# Patient Record
Sex: Female | Born: 1955 | Race: Black or African American | Hispanic: No | Smoking: Never smoker
Health system: Southern US, Community
[De-identification: ages and names within clinical notes are randomized; demographics above are authoritative.]

## PROBLEM LIST (undated history)

## (undated) DIAGNOSIS — I10 Essential (primary) hypertension: Secondary | ICD-10-CM

## (undated) DIAGNOSIS — F0280 Dementia in other diseases classified elsewhere without behavioral disturbance: Secondary | ICD-10-CM

## (undated) DIAGNOSIS — E079 Disorder of thyroid, unspecified: Secondary | ICD-10-CM

## (undated) DIAGNOSIS — R413 Other amnesia: Secondary | ICD-10-CM

## (undated) DIAGNOSIS — E785 Hyperlipidemia, unspecified: Secondary | ICD-10-CM

## (undated) DIAGNOSIS — G300 Alzheimer's disease with early onset: Secondary | ICD-10-CM

## (undated) DIAGNOSIS — E119 Type 2 diabetes mellitus without complications: Secondary | ICD-10-CM

## (undated) HISTORY — DX: Hyperlipidemia, unspecified: E78.5

## (undated) HISTORY — DX: Other amnesia: R41.3

## (undated) HISTORY — PX: COLONOSCOPY: SHX174

## (undated) HISTORY — DX: Disorder of thyroid, unspecified: E07.9

## (undated) HISTORY — PX: BREAST BIOPSY: SHX20

## (undated) HISTORY — PX: PARTIAL HYSTERECTOMY: SHX80

---

## 2000-06-27 ENCOUNTER — Inpatient Hospital Stay (HOSPITAL_COMMUNITY): Admission: AD | Admit: 2000-06-27 | Discharge: 2000-06-28 | Payer: Self-pay | Admitting: Cardiology

## 2002-03-13 ENCOUNTER — Other Ambulatory Visit: Admission: RE | Admit: 2002-03-13 | Discharge: 2002-03-13 | Payer: Self-pay | Admitting: Family Medicine

## 2005-05-09 ENCOUNTER — Emergency Department (HOSPITAL_COMMUNITY): Admission: EM | Admit: 2005-05-09 | Discharge: 2005-05-09 | Payer: Self-pay | Admitting: Emergency Medicine

## 2009-09-05 ENCOUNTER — Ambulatory Visit (HOSPITAL_COMMUNITY): Admission: RE | Admit: 2009-09-05 | Discharge: 2009-09-05 | Payer: Self-pay | Admitting: Internal Medicine

## 2009-09-18 ENCOUNTER — Ambulatory Visit (HOSPITAL_COMMUNITY): Admission: RE | Admit: 2009-09-18 | Discharge: 2009-09-18 | Payer: Self-pay | Admitting: Internal Medicine

## 2011-04-23 NOTE — Discharge Summary (Signed)
[place]  Patient:    Nicole Ward, Nicole Ward                       MRN: [id number] Adm. Date:  06/27/00 Disc. Date: 06/28/00 Attending:  Madolyn Frieze. Jens Som, M.D. [facility] Dictator:   Gene Serpe, P.A.                           Discharge Summary  PROCEDURES:  Exercise Cardiolite on July 24.  REASON FOR ADMISSION:  Please refer to dictated admission note.  LABORATORY DATA:  Cardiac enzymes: CPK 212/1.2, 166/1.3; troponin I less than 0.03 (x 2).  Metabolic profile within normal limits.  LFTs: Elevated total bilirubin of 1.3, otherwise normal.  Normal CBC.  Admission chest x-ray: Low lung volumes, cannot exclude mild infiltrates at lung bases.  HOSPITAL COURSE:  Following admission, the patient ruled out for MI with negative serial cardiac enzymes.  Serial EKGs, however, were notable for mild inferolateral ST changes.  The patient was referred for exercise stress testing.  The patient exercised a total 7 minutes 30 seconds on standard Bruce protocol, achieving 99% of predicted maximal heart rate, 9.3 METs.  No worsening of baseline chest pain was elicited during exercise.  Pseudonormalization of baseline ST abnormalities were noted during exercise with return to baseline by 5-minute recovery mark.  Subsequent perfusion images revealed no abnormalities, EF 61%.  FINAL RECOMMENDATIONS:  Continue Motrin and Pepcid for management of probable musculoskeletal chest discomfort.  The patient will need followup LFTs given the mildly elevated total bilirubin.  DISCHARGE MEDICATIONS: 1. Pepcid 20 mg b.i.d. 2. Motrin 800 mg t.i.d.  DISCHARGE INSTRUCTIONS:  The patient is scheduled to follow up with Olga Millers on Wednesday, August 1, at 4 p.m.  DISCHARGE DIAGNOSES: 1. Noncardiac chest pain with normal exercise Cardiolite July 24. 2. Mildly elevated liver function tests. 3. Ventricular ectopy. 4. History of hyperlipidemia. DD:  06/28/00 TD:  06/28/00 Job:  [id number]

## 2011-05-21 ENCOUNTER — Other Ambulatory Visit (HOSPITAL_COMMUNITY): Payer: Self-pay | Admitting: Internal Medicine

## 2011-05-21 DIAGNOSIS — Z09 Encounter for follow-up examination after completed treatment for conditions other than malignant neoplasm: Secondary | ICD-10-CM

## 2011-05-21 LAB — LIPID PANEL
Cholesterol, Total: 347
LDL Cholesterol: 231 mg/dL

## 2011-05-21 LAB — BASIC METABOLIC PANEL: Calcium: 10.3 mg/dL

## 2011-05-21 LAB — HEPATIC FUNCTION PANEL
ALT: 18 U/L (ref 7–35)
Albumin: 5.1
Bilirubin, Indirect: 1.3
Total Bilirubin: 1.4 mg/dL
platelet count: 294

## 2011-05-26 ENCOUNTER — Ambulatory Visit (HOSPITAL_COMMUNITY)
Admission: RE | Admit: 2011-05-26 | Discharge: 2011-05-26 | Disposition: A | Discharge status: Home / Self Care | Payer: BC Managed Care – PPO | Source: Ambulatory Visit | Attending: Internal Medicine | Admitting: Internal Medicine

## 2011-05-26 DIAGNOSIS — Z09 Encounter for follow-up examination after completed treatment for conditions other than malignant neoplasm: Secondary | ICD-10-CM

## 2011-05-26 DIAGNOSIS — R928 Other abnormal and inconclusive findings on diagnostic imaging of breast: Secondary | ICD-10-CM | POA: Insufficient documentation

## 2011-05-27 ENCOUNTER — Telehealth: Payer: Self-pay

## 2011-05-27 NOTE — Telephone Encounter (Signed)
Called pt and gave her appt for 06/01/2011 at 1:15 pm with Tana Coast, PA. Pt was told to arrive at 1:00 PM to register, adn bring insurance cards and list of medications.

## 2011-05-27 NOTE — Telephone Encounter (Signed)
Referral sent on 05/21/2011. LMOM on 05/21/2011 for pt to call. 05/24/2011 pt called and said she thought she had an appointment with Dr. Jena Gauss on Friday. I checked and told her no. She said she has had some abd pain and I told her that she would need an OV first. She said she was going to call Dr. Felecia Shelling back first. She left VM on 05/25/2011 that she could not come to see Dr. Jena Gauss on Fri ( did not have an appt). York Spaniel that she was going to be out of town this week-end, but that she could see him on Mon or Tues next week.

## 2011-06-01 ENCOUNTER — Ambulatory Visit (INDEPENDENT_AMBULATORY_CARE_PROVIDER_SITE_OTHER): Payer: BC Managed Care – PPO | Admitting: Gastroenterology

## 2011-06-01 ENCOUNTER — Encounter: Payer: Self-pay | Admitting: Gastroenterology

## 2011-06-01 VITALS — BP 154/78 | HR 109 | Temp 98.6°F | Ht 66.0 in | Wt 136.6 lb

## 2011-06-01 DIAGNOSIS — R109 Unspecified abdominal pain: Secondary | ICD-10-CM

## 2011-06-01 DIAGNOSIS — K5909 Other constipation: Secondary | ICD-10-CM | POA: Insufficient documentation

## 2011-06-01 DIAGNOSIS — R1030 Lower abdominal pain, unspecified: Secondary | ICD-10-CM | POA: Insufficient documentation

## 2011-06-01 DIAGNOSIS — Z800 Family history of malignant neoplasm of digestive organs: Secondary | ICD-10-CM

## 2011-06-01 DIAGNOSIS — K5900 Constipation, unspecified: Secondary | ICD-10-CM

## 2011-06-01 DIAGNOSIS — R634 Abnormal weight loss: Secondary | ICD-10-CM

## 2011-06-01 MED ORDER — POLYETHYLENE GLYCOL 3350 GRAN: 17.0000 g | GRANULES | Freq: Every day | Status: DC | PRN

## 2011-06-01 NOTE — Assessment & Plan Note (Signed)
Abnormal weight loss, 50 pounds in the last 3 months per her report. She states she is eating plan he has a great appetite. It has been more than 5 years and she's had blood work done. Arrange for thyroid status to be checked. She also has concerns about the possibility of sexually transmitted diseases such as HIV. Her husband has a drug problem and currently in jail due to cocaine use. She denies any drug use. Admits to a lot of stress. Also proceed with colonoscopy as planned.

## 2011-06-01 NOTE — Progress Notes (Signed)
Primary Care Physician:  Avon Gully, MD  Primary Gastroenterologist:  Jonette Eva  Chief Complaint  Patient presents with  . Abdominal Pain    HPI:  Nicole Ward is a 55 y.o. female here for consideration of colonoscopy. She also complains of lower abdominal pain, chronic constipation, 50 pound unintentional weight loss. Her last colonoscopy she tells me was over 6 years ago. She does not recall where it was done or the findings. Her father had colon cancer in his 51s. He also had alcoholic cirrhosis.  Just saw Dr. Felecia Shelling for the first time to establish care. Has not been to doctor in over six years. Used to be on medications for hyperlipidemia but has been off of them for quite some time. Chronic constipation, trying to eat more fiber. BM twice per week, hard stool. No brbpr, melena. Lower abdominal pain associated with constipation. No n/v, heartburn. Weight down fifty pounds since 02/2011. Used to weight 180+ pounds. States she has a great appetite and eats all the time. She works third shift. She states she's under a lot of stress because her husband is a cocaine user and currently in jail. She was to be checked for STDs. Recent mammogram okay. No recent blood work done.  Current Outpatient Prescriptions  Medication Sig Dispense Refill  . Multiple Vitamin (MULTIVITAMIN) capsule Take 1 capsule by mouth daily.        . Polyethylene Glycol 3350 GRAN Take 17 g by mouth daily as needed.  524 g  5    Allergies as of 06/01/2011  . (No Known Allergies)    Past Medical History  Diagnosis Date  . Hyperlipidemia     off meds for years    Past Surgical History  Procedure Date  . Partial hysterectomy   . Breast biopsy     benign  . Colonoscopy approx 2005    patient not sure of findings or where procedure done    Family History  Problem Relation Age of Onset  . Colon cancer Father     late 43s  . Liver disease Father     cirrhosis, etoh    History   Social History  .  Marital Status: Divorced    Spouse Name: N/A    Number of Children: 1  . Years of Education: N/A   Occupational History  .  [employer]   Social History Main Topics  . Smoking status: Never Smoker   . Smokeless tobacco: Not on file  . Alcohol Use: No  . Drug Use: No  . Sexually Active: Not on file   Other Topics Concern  . Not on file   Social History Narrative   Patient states she remarried her husband in 61. States he is in jail for cocaine use. Under a lot of stress.      ROS:  General: Negative for anorexia, fever, chills, fatigue, weakness. Complains of weight loss Eyes: Negative for vision changes.  ENT: Negative for hoarseness, difficulty swallowing , nasal congestion. CV: Negative for chest pain, angina, palpitations, dyspnea on exertion, peripheral edema.  Respiratory: Negative for dyspnea at rest, dyspnea on exertion, cough, sputum, wheezing.  GI: See history of present illness. GU:  Negative for dysuria, hematuria, urinary incontinence, urinary frequency, nocturnal urination.  MS: Negative for joint pain, low back pain.  Derm: Negative for rash or itching.  Neuro: Negative for weakness, abnormal sensation, seizure, frequent headaches, memory loss, confusion.  Psych: Negative for anxiety, depression, suicidal ideation, hallucinations. Complains of stress. Endo:  50 pound weight loss, unintentional Heme: Negative for bruising or bleeding. Allergy: Negative for rash or hives.    Physical Examination:  BP 154/78  Pulse 109  Temp(Src) 98.6 F (37 C) (Temporal)  Ht 5\' 6"  (1.676 m)  Wt 136 lb 9.6 oz (61.961 kg)  BMI 22.05 kg/m2   General: Well-nourished, well-developed in no acute distress.  Head: Normocephalic, atraumatic.   Eyes: Conjunctiva pink, no icterus. Mouth: Oropharyngeal mucosa moist and pink , no lesions erythema or exudate. Neck: Supple without thyromegaly, masses, or lymphadenopathy.  Lungs: Clear to auscultation bilaterally.  Heart: Regular  rate and rhythm, no murmurs rubs or gallops.  Abdomen: Bowel sounds are normal, nontender, nondistended, no hepatosplenomegaly or masses, no abdominal bruits or    hernia , no rebound or guarding.   Extremities: No lower extremity edema.  Neuro: Alert and oriented x 4 , grossly normal neurologically.  Skin: Warm and dry, no rash or jaundice.   Psych: Alert and cooperative, normal mood and affect.

## 2011-06-01 NOTE — Assessment & Plan Note (Signed)
Chronic constipation associated with lower abdominal pain. Family history of colon cancer, father in his 35s. Her last colonoscopy over 5 years ago. TCS in near future.  I have discussed the risks, alternatives, benefits with regards to but not limited to the risk of reaction to medication, bleeding, infection, perforation and the patient is agreeable to proceed. Written consent to be obtained.  Check thyroid status, calcium level. Add Miralax.

## 2011-06-01 NOTE — Assessment & Plan Note (Signed)
Labs as scheduled.

## 2011-06-04 NOTE — Progress Notes (Signed)
Agree. Add AMITIZA if no lesion on TCS.

## 2011-06-07 ENCOUNTER — Encounter: Payer: BC Managed Care – PPO | Admitting: Gastroenterology

## 2011-06-07 ENCOUNTER — Ambulatory Visit (HOSPITAL_COMMUNITY)
Admission: RE | Admit: 2011-06-07 | Discharge: 2011-06-07 | Disposition: A | Discharge status: Home / Self Care | Payer: BC Managed Care – PPO | Source: Ambulatory Visit | Attending: Gastroenterology | Admitting: Gastroenterology

## 2011-06-07 DIAGNOSIS — R634 Abnormal weight loss: Secondary | ICD-10-CM | POA: Insufficient documentation

## 2011-06-07 DIAGNOSIS — K648 Other hemorrhoids: Secondary | ICD-10-CM | POA: Insufficient documentation

## 2011-06-07 DIAGNOSIS — R109 Unspecified abdominal pain: Secondary | ICD-10-CM

## 2011-06-07 DIAGNOSIS — K5900 Constipation, unspecified: Secondary | ICD-10-CM

## 2011-06-07 DIAGNOSIS — Z800 Family history of malignant neoplasm of digestive organs: Secondary | ICD-10-CM

## 2011-06-07 DIAGNOSIS — K5909 Other constipation: Secondary | ICD-10-CM | POA: Insufficient documentation

## 2011-06-07 NOTE — Progress Notes (Signed)
Cc to Dr. Fanta 

## 2011-06-16 NOTE — Progress Notes (Signed)
Pt came by the office. Would like Tana Coast, PA, to refer to PCP. Per Verlon Au, informed pt to call Dr. Anthony Sar office since they are accepting new pt's now. I also asked pt about the labs, she has not done them yet. She does not have the orders. I told her I would fax to [facility] so she can follow-up as Verlon Au had recommended. Orders faxed!

## 2011-06-21 NOTE — Progress Notes (Signed)
Letter mailed to remind pt to do labs.

## 2011-06-30 NOTE — Op Note (Signed)
  NAME:  Nicole Ward, Nicole Ward              ACCOUNT NO.:  [id number]  MEDICAL RECORD NO.:  [id number]  LOCATION:  [facility]                          FACILITY:  [facility]  PHYSICIAN:  Jonette Eva, M.D.     DATE OF BIRTH:  [birth year]  DATE OF PROCEDURE:  06/07/2011 DATE OF DISCHARGE:                              OPERATIVE REPORT   REFERRING PROVIDER:  Tesfaye D. Felecia Shelling, MD  PROCEDURE:  Ileocolonoscopy.  INDICATION FOR EXAMINATION:  Nicole Ward is a 55 year old female who presents with unintentional 50-pound weight loss, lower abdominal pain, and constipation.  Her father had colon cancer in his 11s, not his 45s. The patient denies nausea and vomiting.  FINDINGS: 1. Normal terminal ileum, approximately 20 cm visualized. 2. Normal colon without evidence of polyps, masses, inflammatory     changes, or diverticular AVMs. 3. Small internal hemorrhoids.  Otherwise, normal retroflexed view of     the rectum.  RECOMMENDATIONS: 1. Screening colonoscopy in 10 years.  Her first-degree relative had     colon cancer at age greater than 59. 2. Use MiraLax once daily. 3. Drink 6-8 cups daily. 4. She should follow a high-fiber diet.  She is given a handout on     high-fiber diet, constipation, and hemorrhoids.  MEDICATIONS: 1. Demerol 75 mg IV. 2. Versed 4 mg IV.  PROCEDURE TECHNIQUE:  Physical exam was performed.  Informed consent was obtained from the patient after explaining the benefits, risks, and alternatives to the procedure.  The patient was connected to the monitor and placed in left lateral position.  Continuous oxygen was provided by nasal cannula and IV medicine administered through an indwelling cannula.  After administration of sedation and rectal exam, the patient's rectum was intubated and the scope was advanced under direct visualization to the distal terminal ileum.  The scope was removed slowly by careful examining the color, texture, anatomy, and integrity of mucosa on the way out.   The patient was recovered in Endoscopy and discharged home in satisfactory condition.     Jonette Eva, M.D.     SF/MEDQ  D:  06/07/2011  T:  06/08/2011  Job:  [id number]  Electronically Signed by Jonette Eva M.D. on 06/30/2011 12:51:01 PM

## 2011-08-24 ENCOUNTER — Other Ambulatory Visit (HOSPITAL_COMMUNITY): Payer: Self-pay | Admitting: Internal Medicine

## 2011-08-24 DIAGNOSIS — R413 Other amnesia: Secondary | ICD-10-CM

## 2011-08-27 ENCOUNTER — Ambulatory Visit (HOSPITAL_COMMUNITY): Payer: BC Managed Care – PPO

## 2011-09-07 ENCOUNTER — Other Ambulatory Visit (HOSPITAL_COMMUNITY): Payer: Self-pay | Admitting: Internal Medicine

## 2011-09-07 DIAGNOSIS — R413 Other amnesia: Secondary | ICD-10-CM

## 2011-09-15 ENCOUNTER — Ambulatory Visit (HOSPITAL_COMMUNITY)
Admission: RE | Admit: 2011-09-15 | Discharge: 2011-09-15 | Disposition: A | Discharge status: Home / Self Care | Payer: BC Managed Care – PPO | Source: Ambulatory Visit | Attending: Internal Medicine | Admitting: Internal Medicine

## 2011-09-15 DIAGNOSIS — H538 Other visual disturbances: Secondary | ICD-10-CM | POA: Insufficient documentation

## 2011-09-15 DIAGNOSIS — R413 Other amnesia: Secondary | ICD-10-CM | POA: Insufficient documentation

## 2012-05-29 ENCOUNTER — Other Ambulatory Visit: Payer: Self-pay | Admitting: Neurology

## 2012-05-29 DIAGNOSIS — R4182 Altered mental status, unspecified: Secondary | ICD-10-CM

## 2012-05-31 ENCOUNTER — Ambulatory Visit (HOSPITAL_COMMUNITY)
Admission: RE | Admit: 2012-05-31 | Discharge: 2012-05-31 | Disposition: A | Discharge status: Home / Self Care | Payer: BC Managed Care – PPO | Source: Ambulatory Visit | Attending: Neurology | Admitting: Neurology

## 2012-05-31 DIAGNOSIS — R209 Unspecified disturbances of skin sensation: Secondary | ICD-10-CM | POA: Insufficient documentation

## 2012-05-31 DIAGNOSIS — R4182 Altered mental status, unspecified: Secondary | ICD-10-CM

## 2012-05-31 DIAGNOSIS — R413 Other amnesia: Secondary | ICD-10-CM | POA: Insufficient documentation

## 2013-04-11 DIAGNOSIS — F32A Depression, unspecified: Secondary | ICD-10-CM | POA: Insufficient documentation

## 2013-04-11 DIAGNOSIS — F329 Major depressive disorder, single episode, unspecified: Secondary | ICD-10-CM | POA: Insufficient documentation

## 2013-05-03 ENCOUNTER — Ambulatory Visit (HOSPITAL_COMMUNITY): Payer: BC Managed Care – PPO | Admitting: Psychology

## 2013-05-08 ENCOUNTER — Ambulatory Visit (INDEPENDENT_AMBULATORY_CARE_PROVIDER_SITE_OTHER): Payer: BC Managed Care – PPO | Admitting: Psychology

## 2013-05-08 DIAGNOSIS — F329 Major depressive disorder, single episode, unspecified: Secondary | ICD-10-CM

## 2013-05-08 DIAGNOSIS — R413 Other amnesia: Secondary | ICD-10-CM

## 2013-05-18 ENCOUNTER — Encounter (HOSPITAL_COMMUNITY): Payer: Self-pay | Admitting: Psychology
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
## 2013-06-05 ENCOUNTER — Ambulatory Visit (INDEPENDENT_AMBULATORY_CARE_PROVIDER_SITE_OTHER): Payer: BC Managed Care – PPO | Admitting: Psychology

## 2013-06-05 DIAGNOSIS — F0280 Dementia in other diseases classified elsewhere without behavioral disturbance: Secondary | ICD-10-CM

## 2013-06-05 DIAGNOSIS — G309 Alzheimer's disease, unspecified: Secondary | ICD-10-CM

## 2013-06-07 ENCOUNTER — Ambulatory Visit (HOSPITAL_COMMUNITY): Payer: Self-pay | Admitting: Psychology

## 2013-06-13 ENCOUNTER — Other Ambulatory Visit (HOSPITAL_COMMUNITY): Payer: Self-pay

## 2013-06-13 DIAGNOSIS — G4730 Sleep apnea, unspecified: Secondary | ICD-10-CM

## 2013-06-15 ENCOUNTER — Ambulatory Visit (INDEPENDENT_AMBULATORY_CARE_PROVIDER_SITE_OTHER): Payer: BC Managed Care – PPO | Admitting: Psychology

## 2013-06-15 DIAGNOSIS — F0280 Dementia in other diseases classified elsewhere without behavioral disturbance: Secondary | ICD-10-CM

## 2013-06-27 ENCOUNTER — Ambulatory Visit: Payer: BC Managed Care – PPO | Attending: Neurology | Admitting: Sleep Medicine

## 2013-06-27 VITALS — Ht 63.0 in | Wt 135.0 lb

## 2013-06-27 DIAGNOSIS — G4730 Sleep apnea, unspecified: Secondary | ICD-10-CM

## 2013-06-27 DIAGNOSIS — R5381 Other malaise: Secondary | ICD-10-CM | POA: Insufficient documentation

## 2013-06-27 DIAGNOSIS — G4710 Hypersomnia, unspecified: Secondary | ICD-10-CM | POA: Insufficient documentation

## 2013-06-27 DIAGNOSIS — R5383 Other fatigue: Secondary | ICD-10-CM | POA: Insufficient documentation

## 2013-06-27 DIAGNOSIS — R0609 Other forms of dyspnea: Secondary | ICD-10-CM | POA: Insufficient documentation

## 2013-06-27 DIAGNOSIS — R0989 Other specified symptoms and signs involving the circulatory and respiratory systems: Secondary | ICD-10-CM | POA: Insufficient documentation

## 2013-06-30 NOTE — Procedures (Signed)
[facility] [name] A. Gerilyn Pilgrim, MD     [email or web address]        NAME:  Nicole Ward, Nicole Ward              ACCOUNT NO.:  [id number]  MEDICAL RECORD NO.:  [id number]          PATIENT TYPE:  OUT  LOCATION:  SLEEP LAB                     FACILITY:  [facility]  PHYSICIAN:  Chaz Mcglasson A. Gerilyn Pilgrim, M.D. DATE OF BIRTH:  Jan 31, 1956  DATE OF STUDY:  06/27/2013                           NOCTURNAL POLYSOMNOGRAM  REFERRING PHYSICIAN:  [name] A. Gerilyn Pilgrim, M.D.  INDICATIONS:  A 57 year old lady, who presents with hypersomnia, daytime fatigue, and difficulty sleeping.  There is also history of snoring.  MEDICATIONS:  Donepezil, glipizide, lisinopril.  EPWORTH SLEEPINESS SCALE:  6.  BMI 24.  ARCHITECTURAL SUMMARY:  The total recording time is 401 minutes.  Sleep efficiency 67%.  Sleep latency is 35%.  REM latency is pathologically early at 4.5.  Stage N1 70%, N2 3%, N 3 0%, and REM sleep 30%.  RESPIRATORY SUMMARY:  Baseline oxygen saturation is 98, lowest saturation 94% during both REM and non-REM sleep.  Diagnostic AHI 1 and RDI 3.  LIMB MOVEMENT SUMMARY:  PLM index zero.  ELECTROCARDIOGRAM SUMMARY:  Average heart rate is 50 with no significant dysrhythmias observed.  IMPRESSION:  Abnormal sleep architecture with pathologically early REM latency, which can be seen in narcolepsy and REM rebound phenomenon. The patient should be set up to have a daytime nap study (MSLT) to evaluate for the potential of narcolepsy.    [name] A. Gerilyn Pilgrim, M.D.    KAD/MEDQ  D:  06/30/2013 13:01:10  T:  06/30/2013 13:23:49  Job:  [id number]

## 2013-09-03 ENCOUNTER — Encounter (HOSPITAL_COMMUNITY): Payer: Self-pay | Admitting: Psychology

## 2013-09-03 NOTE — Progress Notes (Signed)
Provided feedback regarding the results of the  recent neuropsychological evaluation. results of this evaluation can be found in the report of 06/05/2013. The patient is scheduled to return in 6 months for reassessment as there continues to be an ongoing differential diagnostic question regarding vascular mediated dementia versus early onset Alzheimer's.

## 2013-09-03 NOTE — Progress Notes (Signed)
The patient was assessed on 06/05/2013.  The patient was administered a neuropsychological test battery consisting of the Wechsler Adult Intelligence Scale-III and the Wechsler Memory Scale-III.  Behavioral observations suggest that the patient fully participated and gave her best effort and therefore this does appear to be a valid assessment and the patient did appear to try her hardest.  While the patient was somewhat anxious about doing as well as she  could she adapted to the testing situation fairly well and this does appear to be a fair and valid sample of her current functioning.  GENERAL INTELLECTUAL FUNCTIONING:  The patient's performance on the Weschler Adult Intellectual Scale-III, classifies her current intellectual abilities to be in the average Range, with a Full Scale IQ score of 90, a Verbal IQ score of 91, and a Performance IQ score of 87.  Overall, her performance places her at the 25th percentile with regard to her age-related peer group.  Analysis on Index Scores produced a Verbal Comprehension Index Score of 93, a Perceptual Organization Index Score of 82, a Working Memory Index Score of 99, and a Processing Speed Index Score of 99.   Overall, WAIS-III performance suggest that the patient's VIQ vs. PIQ were consistent with each other there was no indication of global differences between her verbal IQ versus her performance/visual spatial IQ. However, with regard to index scores there were some relative weaknesses with regard to her visual spatial abilities and while these were less than clinically significant they do suggest some visuospatial weaknesses consistent with her geographic disorientation.  Below are the Age-Adjusted scores for each of the individual WAIS-III subtests.  Vocabulary:    9 Similarities:    10 Arithmetic:    9 Digit Span:    11 Information:    7 Comprehension:   5 Letter Number Sequencing:  10  Picture Completion:   9 Digit Symbol:    10 Block  Design:    7 Matrix Reasoning:   7 Picture Arrangement:   7 Symbol Search:   10  WAIS-III analysis reveals a VIQ score of 91, which places her at the 27th percentile.  There was significant scatter was noted in subtest performance.  The patient displayed average performance with regard to her general vocabulary knowledge, verbal reasoning abilities, auditory encoding and auditory attention, and other measures of working memory. Significant relative and comparison deficits were noted with regard to her general fund of information and social comprehension and reasoning abilities.  WAIS-III analysis reveals a PIQ score of 87, which places her at the 18th percentile. There was significant scatter was seen on these measures.   The patient displayed average performance with regard to identifying anomalies out of a visual Gestalt, visual scanning/visual searching and speed in mental operations. Significant deficits were identified with regard to visual analysis and organization, visual reasoning, and visual sequencing.  Overall, WAIS-III performance was in the lower end of the average range.  This pattern of strengths and weaknesses however, suggest some average performances in almost all areas except with regard to her visual analysis and organization and visual spatial skills. The patient also showed significant reductions in her general fund of information and her social judgment and comprehension skills.  ATTENTION/CONCENTRATION AND MEMORY:  On the WMS-III, the patient obtained a Working Memory (Attention and Encoding)  Score of 91, which is consistent with what was predicted by her WAIS-III FSIQ of 90.  Items making up this Index suggest that auditory encoding (Letter-Number Sequencing Scaled Score = 10,  Visual encoding (Spatial Span Scaled Score = 7) is consistent with average performance with regard to auditory encoding..  This overall working memory score does suggest   that is a general strength for  the patient and her inability to maintain good auditory and visual encoding was clearly present during the testing.       Predicted based on FSIQ:  Actual Score  Working Memory:    93    91 Immediate Memory:    94    55 Auditory Immediate:    94    59 Visual Immediate:    96    68 General Memory:    94    62 Auditory Delayed:    94    58 Visual Delayed:    96    75 Auditory Recognition Delayed:   95    70  Anterograde memory functions, as analyzed by the WMS-III, produced an Immediate Memory Index Score of  55, which is  significantly impaired and well below what would be predicted by the [facility] of  90 (prediction was  94). There was  a less than significant difference between the patient's performance on immediate auditory memory versus immediate visual memory abilities as she produced an Verbal Immediate Memory Index Score of  59 and a Visual Immediate Memory Index Score of 68.  However, both of these scores were significantly below predicted levels and consistent with significant memory problems for both visual and auditory information.  Analyses of Delayed Recall of previously presented information produce a General Memory Index Score of  62.  This performance is  significantly below predicted levels and suggest significant memory deficits.  There was  a significant difference between delayed auditory memory versus delayed visual memory, as the patient produced an Auditory Delayed Index Score of  58 and a Visual Delayed Index Score of  75.    she did show a  significant deficit initial learning of auditory information and  significant impairment retention of information.  This suggests  Little benefit from repeated exposures to auditory information. When this recall of auditory material after a delay is changed from a free recall format to a recognition format, the patient's performance showed  mild improvement and suggested that some of the reported memory difficulties have to do with  both storage  and organization of new learning as well as the ability to retrieve information when placed an acute recall format.  CONCLUSIONS:    The overall results of these formal and objective measures of cognitive performance suggest someone who is functioning generally in the lower end of the average range of global intellectual abilities with isolated deficits with regard to her social judgment and comprehension and general fund of information as well as mild to moderate deficits with regard to visual spatial and visual organizational abilities. However, the patient significant deficits have to do with global reductions in her ability to store in learning new information. While the patient's encoding abilities both in regards to her auditory and visual encoding were efficient and consistent with old be predicted based on stable and long-standing cognitive functioning measures the patient showed significant deficits learning new information with auditory learning and memory more impaired and visual learning and memory. However, both were in the impaired to significantly impaired range. The patient showed deficits even after immediately been presented with information and then asked to recall. When placed in a qued recall format, the patient showed very limited improvement, which would suggest that these memory deficits  are not simply problems with regard to retrieval of learn information but are in fact deficits with regard to storage and organization of information in the learning process.  While the patient does have a generally complicated medical picture with abnormalities shown on her MRI that may be related to vascular changes subsequent to type 2 diabetes or other neurological insults, the patient is showing some significant deficits consistent with those found with early-onset dementia of the Alzheimer's type. The patient shows a pattern of significant deficits with regard to visual spatial abilities and  reports of geographic disorientation during the clinical interview. The pattern of memory deficits are more heavily weighted towards auditory learning deficits. She also shows a maintenance of both auditory and visual encoding abilities but severe deficits in storage, organization and retrieval of learned information. Vascular deficits tend to be more heavily weighted towards retrieval deficits with acute recall showing improvement. While this is the current working diagnosis it does remain possible that her memory deficits and cognitive impairments have to do with vascular changes subsequent to type 2 diabetes. Because of this potential vascular involvement, it will be important to reassess the patient in 6-9 months to evaluate and assess patterns of changes over this period of time. It will be imperative for the patient to closely monitor her blood glucose levels and do everything she can to exert as much control over her blood glucoses possible.

## 2013-09-24 ENCOUNTER — Other Ambulatory Visit (HOSPITAL_COMMUNITY): Payer: Self-pay | Admitting: Internal Medicine

## 2013-09-24 DIAGNOSIS — Z139 Encounter for screening, unspecified: Secondary | ICD-10-CM

## 2013-09-25 ENCOUNTER — Telehealth (HOSPITAL_COMMUNITY): Payer: Self-pay | Admitting: *Deleted

## 2013-10-02 ENCOUNTER — Ambulatory Visit (HOSPITAL_COMMUNITY): Payer: Self-pay

## 2013-10-03 ENCOUNTER — Ambulatory Visit (HOSPITAL_BASED_OUTPATIENT_CLINIC_OR_DEPARTMENT_OTHER): Payer: Self-pay

## 2013-12-17 ENCOUNTER — Ambulatory Visit (HOSPITAL_COMMUNITY): Payer: Self-pay | Admitting: Psychology

## 2014-04-03 ENCOUNTER — Other Ambulatory Visit (HOSPITAL_COMMUNITY): Payer: Self-pay | Admitting: *Deleted

## 2014-04-03 DIAGNOSIS — Z1231 Encounter for screening mammogram for malignant neoplasm of breast: Secondary | ICD-10-CM

## 2014-04-11 ENCOUNTER — Ambulatory Visit (HOSPITAL_COMMUNITY)
Admission: RE | Admit: 2014-04-11 | Discharge: 2014-04-11 | Disposition: A | Discharge status: Home / Self Care | Payer: BC Managed Care – PPO | Source: Ambulatory Visit | Attending: *Deleted | Admitting: *Deleted

## 2014-04-11 DIAGNOSIS — Z1231 Encounter for screening mammogram for malignant neoplasm of breast: Secondary | ICD-10-CM

## 2014-05-06 DIAGNOSIS — E079 Disorder of thyroid, unspecified: Secondary | ICD-10-CM

## 2014-05-06 HISTORY — DX: Disorder of thyroid, unspecified: E07.9

## 2014-08-05 ENCOUNTER — Ambulatory Visit (INDEPENDENT_AMBULATORY_CARE_PROVIDER_SITE_OTHER): Payer: Medicaid Other | Admitting: Neurology

## 2014-08-05 ENCOUNTER — Encounter: Payer: Self-pay | Admitting: Neurology

## 2014-08-05 VITALS — BP 147/78 | HR 71 | Ht 64.5 in | Wt 167.0 lb

## 2014-08-05 DIAGNOSIS — G309 Alzheimer's disease, unspecified: Secondary | ICD-10-CM

## 2014-08-05 DIAGNOSIS — R9082 White matter disease, unspecified: Secondary | ICD-10-CM

## 2014-08-05 DIAGNOSIS — F32A Depression, unspecified: Secondary | ICD-10-CM

## 2014-08-05 DIAGNOSIS — F3289 Other specified depressive episodes: Secondary | ICD-10-CM

## 2014-08-05 DIAGNOSIS — R930 Abnormal findings on diagnostic imaging of skull and head, not elsewhere classified: Secondary | ICD-10-CM

## 2014-08-05 DIAGNOSIS — F411 Generalized anxiety disorder: Secondary | ICD-10-CM

## 2014-08-05 DIAGNOSIS — F329 Major depressive disorder, single episode, unspecified: Secondary | ICD-10-CM

## 2014-08-05 DIAGNOSIS — F0280 Dementia in other diseases classified elsewhere without behavioral disturbance: Secondary | ICD-10-CM | POA: Insufficient documentation

## 2014-08-05 NOTE — Progress Notes (Addendum)
Subjective:    Patient ID: Nicole Ward is a 58 y.o. female.  HPI    Nicole Foley, MD, PhD [facility] [address] [address] [place], [place] [address]  Dear Nicole Ward,    I saw your patient, Nicole Ward, upon your kind request in my neurologic clinic today for initial consultation of her memory loss, concern for Alzheimer's disease. The patient is accompanied by her sister, Nicole Ward, today. As you know, Nicole Ward is a 58 year old right-handed woman with an underlying medical history of hypothyroidism, hypertension, recent diagnosis of type 2 diabetes, hyperlipidemia, and depression, who has had progressive memory loss for the past 2 years. She is currently on Aricept 10 mg strength and has been on it for about 2 years. She has had some anxiety recently. She resides with her sister. She is reportedly still driving but does not feel comfortable going far by herself and has gotten lost. She had a previous neurological workup in the form of the EEG in the past which was negative according to your note. CT head in the past showed white matter changes. She has no personal history of epilepsy or stroke or migraine headaches. She's had some recent recurrent headaches with relief with over-the-counter Advil or Aleve. She has seen Dr. Kieth Ward, psychologist in the past and I reviewed his records from June 2014. His impression was memory difficulties, executive function deficits angiographic disorientation. He felt that her memory difficulties were beyond those typically seen with depression and stress and he suggested neuro cognitive testing. The patient had her neuropsychological evaluation on 06/05/2013. I reviewed the test results. The impression was that findings were consistent with those found with early-onset dementia of the Alzheimer's type. While the patient does have a generally complicated medical picture with abnormalities shown on the MRI brain that may be  related to vascular changes, his impression was that of early-onset Alzheimer's disease. He recommended re-testing in 6-9 months, but this was not done.  She had a brain MRI without contrast on 05/31/2012: No acute intracranial abnormality. Multiple small subcortical white matter hyperintensities are present. These may be due to chronic microvascular ischemia, migraine headaches, vasculitis also possible. In addition, I personally reviewed the images through the PACS system. Prior to that she had a head CT without contrast on 09/15/2011: Showed no acute abnormality. In addition, I personally reviewed the images through the PACS system. Her vascular risk factors include hyperlipidemia, diabetes, hypertension, overweight state with a BMI of 28.3. She reports a lot of stress with respect to her husband's addiction and she got divorced, but re-married her ex-husband, and lost her job, income, her home and has been divorced for 2 years.  She used to see Dr. Gerilyn Ward, but d/t insurance issues, she could not go back to him.  Her MGM had some memory loss and mother is 47 yo and has had some memory loss, but both had memory loss in their older age.  She has had some weight loss. She has anxiety and fear, but no paranoia. She worked third shift at [employer] for about 8 years and was laid off in 11/13. She now lives with her daughter who work 12 h shift and her 42 yo GD, who is in school and sometimes she feels fearful of staying at home. She is depressed, but is not on any medication. She was on Remeron, but gained.  Symptom onset was gradual and are now fairly stable. She primarily has been having difficulty with short-term  memory such as forgetfulness, misplacing things, asking the same question again and forgetting dates and events. There is some report of confusion or disorientation. Familiar faces are easily recognized. She drives within 5 m radius, not at night and no interstate.  She reports some headaches.  She  has a history of smoking marijuana. She is a non-smoker. She does not drink alcohol. she denies suicidal ideations or homicidal ideations.   The patient denies prior TIA or stroke symptoms, such as sudden onset of one sided weakness, numbness, tingling, slurring of speech or droopy face, hearing loss, tinnitus, diplopia or visual field cut or monocular loss of vision.  Of note, the patient denies snoring, and there is no report of witnessed apneas.   Her Past Medical History Is Significant For: Past Medical History  Diagnosis Date  . Hyperlipidemia     off meds for years    Her Past Surgical History Is Significant For: Past Surgical History  Procedure Laterality Date  . Partial hysterectomy    . Breast biopsy      benign  . Colonoscopy  approx 2005    patient not sure of findings or where procedure done    Her Family History Is Significant For: Family History  Problem Relation Age of Onset  . Colon cancer Father     late 23s  . Liver disease Father     cirrhosis, etoh    Her Social History Is Significant For: History   Social History  . Marital Status: Divorced    Spouse Name: N/A    Number of Children: 1  . Years of Education: 12   Occupational History  .  [employer]   Social History Main Topics  . Smoking status: Never Smoker   . Smokeless tobacco: None  . Alcohol Use: No  . Drug Use: No  . Sexual Activity: None   Other Topics Concern  . None   Social History Narrative   Patient states she remarried her husband in 52. States he is in jail for cocaine use. Under a lot of stress.    Her Allergies Are:  Allergies  Allergen Reactions  . Trazodone And Nefazodone Palpitations  :   Her Current Medications Are:  Outpatient Encounter Prescriptions as of 08/05/2014  Medication Sig  . donepezil (ARICEPT) 10 MG tablet Take 10 mg by mouth at bedtime.  Marland Kitchen glipiZIDE (GLUCOTROL) 5 MG tablet Take by mouth daily before breakfast.  . levothyroxine (SYNTHROID,  LEVOTHROID) 25 MCG tablet Take 25 mcg by mouth daily before breakfast.  . lisinopril (PRINIVIL,ZESTRIL) 10 MG tablet Take 10 mg by mouth daily.  Marland Kitchen lovastatin (MEVACOR) 20 MG tablet Take 20 mg by mouth at bedtime.  . [DISCONTINUED] Polyethylene Glycol 3350 GRAN Take 17 g by mouth daily as needed.  . [DISCONTINUED] Multiple Vitamin (MULTIVITAMIN) capsule Take 1 capsule by mouth daily.    :  Review of Systems:  Out of a complete 14 point review of systems, all are reviewed and negative with the exception of these symptoms as listed below:  Review of Systems  Constitutional: Positive for fever and fatigue.       Weight gain  Eyes:       Blurred vision  Endocrine:       Feeling cold  Musculoskeletal:       Joint pain  Hematological:       Anemia  Psychiatric/Behavioral:       Anxiety Not enough sleep Decreased energy Change in appetite Disinterest in activities  Insomnia     Objective:  Neurologic Exam  Physical Exam Physical Examination:   Filed Vitals:   08/05/14 0947  BP: 147/78  Pulse: 71    General Examination: The patient is a very pleasant 58 y.o. female in no acute distress. She is calm and cooperative with the exam. She denies Auditory Hallucinations and Visual Hallucinations. She is well groomed and situated in a chair. She appears depressed, is tearful at one time and appears anxious.   HEENT: Normocephalic, atraumatic, pupils are equal, round and reactive to light and accommodation. Funduscopic exam is normal with sharp disc margins noted. Extraocular tracking shows saccadic breakdown without nystagmus noted. Hearing is intact. Tympanic membranes are clear bilaterally. Face is symmetric with no facial masking and normal facial sensation, but she seems to have R facial tic, but she nor her sister were aware of it. There is no lip, neck or jaw tremor. Neck is not rigid with intact passive ROM. There are no carotid bruits on auscultation. Oropharynx exam reveals mild  mouth dryness. No significant airway crowding is noted. Mallampati is class II. Tongue protrudes centrally and palate elevates symmetrically.    Chest: is clear to auscultation without wheezing, rhonchi or crackles noted.  Heart: sounds are regular and normal without murmurs, rubs or gallops noted.   Abdomen: is soft, non-tender and non-distended with normal bowel sounds appreciated on auscultation.  Extremities: There is no pitting edema in the distal lower extremities bilaterally. Pedal pulses are intact.   Skin: is warm and dry with no trophic changes noted. Age-related changes are noted on the skin.   Musculoskeletal: exam reveals no obvious joint deformities, tenderness or joint swelling or erythema.   Neurologically:  Mental status: The patient is awake and alert, paying good  attention. She is able to completely provide the history. Her sister provides details. She is oriented to: person, place, time/date, situation, day of week, month of year and year. Her memory, attention, language and knowledge are impaired. There is no aphasia, agnosia, apraxia or anomia. There is a mild degree of bradyphrenia. Speech is mildly hypophonic with no dysarthria noted. Mood is depressed and affect is constricted. Her MMSE (Mini-Mental state exam) score is 26/30.  CDT (Clock Drawing Test) score is 4/4.  AFT (Animal Fluency Test) score is 11.  Her geriatric depression score is 8.  Cranial nerves are as described above under HEENT exam. In addition, shoulder shrug is normal with equal shoulder height noted.  Motor exam: Normal bulk, and strength for age is noted. Tone is not rigid with absence of cogwheeling in the extremities. There is overall no bradykinesia. There is no drift or rebound. There is no tremor. Romberg is negative. Reflexes are 2+ in the upper extremities and 2+ in the lower extremities. Toes are downgoing bilaterally. Fine motor skills: Finger taps, hand movements, and rapid alternating  patting are Not impaired bilaterally. Foot taps and foot agility are Not impaired bilaterally.   Cerebellar testing shows no dysmetria or intention tremor on finger to nose testing. Heel to shin is unremarkable. There is no truncal or gait ataxia.   Sensory exam is intact to light touch, pinprick, vibration, temperature sense and proprioception in the upper and lower extremities.   Gait, station and balance: She stands up from the seated position with no significant difficulty. No veering to one side is noted. No leaning to one side. Posture is Age-appropriate. Stance is narrow-based. She turns in 2 steps. Tandem walk is possible After initial hesitation .  Balance is Not particularly impaired.   Assessment and Plan:   In summary, AMERIS AKAMINE is a very pleasant 58 y.o.-year old female with an underlying medical history of hypothyroidism, hypertension, recent diagnosis of type 2 diabetes, hyperlipidemia, and depression, who has had progressive memory loss for the past 2 years. She is currently on Aricept 10 mg strength and has been on it for about 2 years. Her neuropsychological evaluation from July 2014 suggested early-onset Alzheimer's disease but she was advised to followup fairly closely due to some vascular changes seen on her MRI. She did not go back for retesting which was advised at the time. Her memory scores indicate mild dementia. She clearly appears to be depressed as well. Her physical exam is nonfocal currently.   I had a long chat with the patient and [name] about my findings and the diagnosis of memory loss and dementia, its prognosis and treatment options. Implications of diagnosis explained at length with the patient and caregiver.. We talked about medical treatments and non-pharmacological approaches. We talked about maintaining a healthy lifestyle in general and staying active mentally and physically. I encouraged the patient to eat healthy, exercise daily and keep well hydrated, to  keep a scheduled bedtime and wake time routine, to not skip any meals and eat healthy snacks in between meals and to have protein with every meal. I stressed the importance of regular exercise, within of course the patient's own mobility limitations. I encouraged the patient to keep up with current events by reading the news paper or watching the news and to do word puzzles, or if feasible, to go on [email or web address].   As far as further diagnostic testing is concerned, I suggested the following: MRI brain without contrast, blood work today, and I would like for her to see Dr. Kieth Ward, psychologist again for repeat neuropsychological evaluation. in addition, I would like for her to discuss with you management of her anxiety and depression, including starting her potentially on an antidepressant low dose. As far as her dementia medication, I would like to keep her on Aricept 10 mg strength. I would not necessarily add a new agent at this time. I would like to see her back in about 6 months for reevaluation. .  I answered all their questions today and the patient and her sister were in agreement with the above outlined plan. Thank you very much for allowing me to participate in the care of this nice patient. If I can be of any further assistance to you please do not hesitate to call me at [phone].  Sincerely,   Nicole Foley, MD, PhD

## 2014-08-05 NOTE — Patient Instructions (Signed)
You have complaints of memory loss: memory loss or changes in cognitive function can have many reasons and does not always mean you have dementia. Conditions that can contribute to subjective or objective memory loss include: depression, stress, poor sleep from insomnia or sleep apnea, dehydration, fluctuation in blood sugar values, thyroid or electrolyte dysfunction. Dementia can be causes by stroke, brain atherosclerosis and by Alzheimer's disease or other, more rare and sometimes hereditary causes. We will do some additional testing: I would like to check blood work, and would like for you to see Dr. Kieth Brightly, psychologist in [place] again. Please talk to [facility] about starting an antidepressant. We will continue with Aricept and not add any new medication as yet.  You may benefit from seeing a therapist.

## 2014-08-06 LAB — VITAMIN D 25 HYDROXY (VIT D DEFICIENCY, FRACTURES): Vit D, 25-Hydroxy: 10.4 ng/mL — ABNORMAL LOW (ref 30.0–100.0)

## 2014-08-06 LAB — COMPREHENSIVE METABOLIC PANEL
A/G RATIO: 2.1 (ref 1.1–2.5)
ALT: 18 IU/L (ref 0–32)
AST: 21 IU/L (ref 0–40)
Albumin: 4.8 g/dL (ref 3.5–5.5)
Alkaline Phosphatase: 69 IU/L (ref 39–117)
BILIRUBIN TOTAL: 1 mg/dL (ref 0.0–1.2)
BUN/Creatinine Ratio: 13 (ref 9–23)
BUN: 13 mg/dL (ref 6–24)
CO2: 23 mmol/L (ref 18–29)
Calcium: 9.3 mg/dL (ref 8.7–10.2)
Chloride: 101 mmol/L (ref 97–108)
Creatinine, Ser: 0.98 mg/dL (ref 0.57–1.00)
GFR, EST AFRICAN AMERICAN: 73 mL/min/{1.73_m2} (ref >59)
GFR, EST NON AFRICAN AMERICAN: 63 mL/min/{1.73_m2} (ref >59)
Globulin, Total: 2.3 g/dL (ref 1.5–4.5)
Glucose: 106 mg/dL — ABNORMAL HIGH (ref 65–99)
POTASSIUM: 4.1 mmol/L (ref 3.5–5.2)
SODIUM: 142 mmol/L (ref 134–144)
Total Protein: 7.1 g/dL (ref 6.0–8.5)

## 2014-08-06 LAB — CBC WITH DIFFERENTIAL
BASOS: 0 %
Basophils Absolute: 0 10*3/uL (ref 0.0–0.2)
EOS: 1 %
Eosinophils Absolute: 0 10*3/uL (ref 0.0–0.4)
HEMATOCRIT: 36.9 % (ref 34.0–46.6)
HEMOGLOBIN: 12.3 g/dL (ref 11.1–15.9)
IMMATURE GRANS (ABS): 0 10*3/uL (ref 0.0–0.1)
Immature Granulocytes: 0 %
LYMPHS ABS: 0.9 10*3/uL (ref 0.7–3.1)
Lymphs: 16 %
MCH: 27.8 pg (ref 26.6–33.0)
MCHC: 33.3 g/dL (ref 31.5–35.7)
MCV: 83 fL (ref 79–97)
MONOS ABS: 0.4 10*3/uL (ref 0.1–0.9)
Monocytes: 7 %
NEUTROS ABS: 4.3 10*3/uL (ref 1.4–7.0)
NEUTROS PCT: 76 %
Platelets: 263 10*3/uL (ref 150–379)
RBC: 4.43 x10E6/uL (ref 3.77–5.28)
RDW: 15.3 % (ref 12.3–15.4)
WBC: 5.7 10*3/uL (ref 3.4–10.8)

## 2014-08-06 LAB — RPR: SYPHILIS RPR SCR: NONREACTIVE

## 2014-08-06 LAB — B12 AND FOLATE PANEL
Folate: 18.4 ng/mL (ref >3.0)
Vitamin B-12: 447 pg/mL (ref 211–946)

## 2014-08-06 LAB — TSH: TSH: 1.64 u[IU]/mL (ref 0.450–4.500)

## 2014-08-06 LAB — HGB A1C W/O EAG: Hgb A1c MFr Bld: 6.4 % — ABNORMAL HIGH (ref 4.8–5.6)

## 2014-08-06 NOTE — Progress Notes (Signed)
Quick Note:  Please call patient's sister regarding patient's blood work: Her diabetes marker was borderline elevated. As I understand she was recently diagnosed with diabetes. In that case her diabetes marker is actually not bad and would indicate reasonable blood sugar control. However, her vitamin D level was low and she needs to get started on an over-the-counter vitamin D supplement, 2000 units by mouth once daily and I would recommend that they talk to her primary care physician about potential prescription strength vitamin D and recheck a vitamin D level in about 3 months. Please advise the patient's sister that everything else looked fine including vitamin B12 level and that I would recommend addressing the low vitamin D with primary care physician but in the interim get her started on an over-the-counter vitamin D supplement as mentioned. [name], MD, PhD [facility]  ______

## 2014-09-05 ENCOUNTER — Ambulatory Visit (HOSPITAL_COMMUNITY): Payer: MEDICAID | Admitting: Psychology

## 2014-10-08 ENCOUNTER — Ambulatory Visit (INDEPENDENT_AMBULATORY_CARE_PROVIDER_SITE_OTHER): Payer: MEDICAID | Admitting: Psychology

## 2014-10-08 DIAGNOSIS — F0150 Vascular dementia without behavioral disturbance: Secondary | ICD-10-CM

## 2014-10-08 DIAGNOSIS — R413 Other amnesia: Secondary | ICD-10-CM

## 2014-10-23 NOTE — Progress Notes (Signed)
GENERAL INTELLECTUAL FUNCTIONING:  The patient has now been tested on two separate occasions.  The first was on 06/05/2013 and this one was conducted 10/08/2014.  Both of these assessments will be addressed with comparisons made between each assessment to evaluate for any differences or progressive changes.    The patient produced a full-scale IQ score of 90 with a percentile rank of 25 in July 2014. The current assessmentin 2015 produces a full-scale IQ score of 89 with a percentile rank of 23. These 2 measures are essentially the same given the general confidence intervals and variability within the testing situation. In 2014 the patient produced a verbal IQ score of 91, which fell of the 27th percentile.  Most recently, her IQ verbal like you score fell and 88 with a percentile rank of 21. Again this is very similar performance and within just a couple of IQ points between each other. 2014 assessment produced a performance IQ score of 90 with a percentile rank of 25. 2015 assessment produced a performance IQ score of 92 at a percentile rank of 30. Overall, both of these performances are generally consistent with each other with only a few IQ points of little to no statistical significance difference. 2014 assessment found a global IQ score in the very low end of the average range and the current assessment finds a global IQ performances at the very highest low average range. There is only a 1 IQ point Difference between these 2 global assessments.  Overall, both the 2014 and 2015 WAIS-III performance suggest that the patient's VIQ vs. PIQ were extremely consistent with each other indicating no significant difference between verbal and performance cognitive functioning.    Below are the Age-Adjusted scores for each of the individual WAIS-III subtests.   Leticia ClasJanet  Adduci   06/05/2015  10/08/2014  Vocabulary:9   9 Similarities:10   10 Arithmetic:9   7 Digit Span:11   10 Information:7   7 Comprehension:5   5 Letter Number Sequencing:10   8  Picture Completion:9   9 Digit Symbol:10   9 Block Design:7   8 Matrix Reasoning:7   9 Picture Arrangement:10   10 Symbol Search:10   10   as far as verbal IQ subtest measures the patient continued to perform been very consistent pattern to previous assessments. The patient showed average performance with regard to her vocabulary abilities, verbal reasoning abilities, auditory encoding abilities, and general working memory functioning. The patient did show some mild attentional issues with regard to in more complex attentional abilities and the patient showed some indication of long-term difficulties with regard to her general fund of information and  Social judgment and comprehension abilities.  This far as performance IQ measures, she again produced scores that were very consistent with the assessment nearly a year and a half ago. The patient showed some minor decreases with regard to information processing speed on 1 measurement but generally all other measures with regard performance IQ subtest were consistent with each other. The patient showed average performance with regard to the ability to receive and process visual gestalt and visual attention, visual searching and visual sequencing abilities, visual analysis and organization, visual reasoning, visual sequencing. The  patient did show some decrease in visual scanning and speed of mental operations on this final measure but she showed good performance on another similar measure.  Overall, both of these broad measures of neuropsychological and cognitive functioning fall in the lower end of the average range to upper end of  the low average range.  the July 2014 and November 2015 assessments were extremely consistent with each other suggesting no progressive decline or deterioration in overall cognitive and intellectual functioning. These measures were all generally within the average to lower end of the average range with the exception of her social judgment and comprehension abilities and some more complex cognitive speed measures and complex attentional measures. There is no indication of any progressive decline with the patient.   ATTENTION/CONCENTRATION AND MEMORY:  Again, the patient was administered the Wechsler Memory Scale-III on both June 05, 2013 and just a few days ago on October 08, 2014. These performances will be compared to one another to facilitate more accurate diagnostic interpretations.  On the WMS-III, the patient obtained a Working Memory (Attention and Encoding) Score of 91, which is consistent with what was predicted by her WAIS-III FSIQ of 90. Items making up this Index suggest that auditory encoding (Letter-Number Sequencing Scaled Score = 10, Visual encoding (Spatial Span Scaled Score = 7) is consistent with average performance with regard to auditory encoding.. This overall working memory score does suggest  that is a general strength for the patient and her inability to maintain good auditory and visual encoding was clearly present during the testing.  2014Predicted based on [id number]:2014 Actual Score   2015 [facility]  2015 Actual Score  Working  Memory:9391      93     79 Immediate Memory:9455      94     67 Auditory Immediate:9459      94     65 Visual Immediate:9668      96     81 General Memory:9462      93     63 Auditory Delayed:9458      94     58 Visual Delayed:9675      95     84 Auditory Recognition Delayed: 9570      95     60  Comparing these 2 performances from 2014 versus 2015 the patient shows an overall consistent pattern of significant auditory memory deficits. The patient does show some individual worsening with regard to her working memory and her auditory cued recall she also showed some improvement with regard to her visual learning and memory. However, she continues to show significant deficits with regard to her auditory learning and memory both with regard to her ability to learn new information as well as store and retrieve that information after a period of delay. This pattern is consistent with the general pattern seen in the 2014 assessment.   CONCLUSIONS:    The overall results looking individually as as well as comparatively to the assessment done in July 2014 and the assessment done in November 2015 are consistent with significant memory deficits. Her general intellectual and cognitive functioning is in the low end of the average range with no  specific neuropsychological deficit with regard to her overall intellectual and neuropsychological functioning in these measures. However, when comparing her global intellectual abilities versus her memory and learning abilities there are start differences. The patient continues to show significant deficits with regard to her ability to store and learn new information. While the patient's auditory and visual encoding abilities were generally insufficient the patient showed significant deficits learning new information and most notably these deficits were most profound with regard to learning auditory information with lesser deficits with regard to learning visual information.   Now that we have 2 comparisons made approximately  14 or 15 months apart a more definitive diagnostic picture emerges. The patient is showing no clear progressive decline in her neuropsychological functioning. The deficits that she displayed in 2014 are essentially the same deficits displayed today. There was very little variability in comparison measures with the exception of some mild improvement with regard to visual learning abilities. However, she does continue to show some significant and profound memory deficits particularly learning new language auditory information. The fact that these 2 assessments are so consistent statistically would strongly suggest that the current neuropsychological deficits would not be consistent with those typically found with dementia of the Alzheimer's type. While this was considered during the previous assessment the current assessment in comparison to that does not suggest that this consideration should be continued. These 2 assessments do strongly point to the fact that she is experiencing neuropsychological and neurological changes most likely to do with vascular changes related to type 2 diabetes. There have been some findings of structural changes consistent with small vessel disease and other  changes neurologically that can happen in response to type 2 diabetes. I do think that the memory and cognitive deficits the patient is experiencing are related to this condition. As before, I think it is imperative that the patient closely monitor her blood glucose levels and closely monitor her dietary patterns. While the patient is likely to continue to have ongoing difficulties related to this underlying medical condition the progression is not likely to be as profound as those associated with Alzheimer's type dementia. However, there is very little indication that the patient was show any improvement from her current deficits as they're clearly related to structural changes in the brain both with with regard to vascular structures as well as neural structures and changes. The appropriate diagnosis at this point would be vascular dementia.

## 2015-01-15 ENCOUNTER — Telehealth: Payer: Self-pay | Admitting: *Deleted

## 2015-01-15 NOTE — Telephone Encounter (Signed)
Called and confirmed/new appointment time with patient's sister, verbalized understanding.

## 2015-02-03 ENCOUNTER — Encounter: Payer: Self-pay | Admitting: Neurology

## 2015-02-03 ENCOUNTER — Ambulatory Visit (INDEPENDENT_AMBULATORY_CARE_PROVIDER_SITE_OTHER): Payer: Medicare Other | Admitting: Neurology

## 2015-02-03 VITALS — BP 113/69 | HR 69 | Temp 97.3°F | Resp 14 | Ht 66.0 in | Wt 142.0 lb

## 2015-02-03 DIAGNOSIS — R930 Abnormal findings on diagnostic imaging of skull and head, not elsewhere classified: Secondary | ICD-10-CM | POA: Diagnosis not present

## 2015-02-03 DIAGNOSIS — E559 Vitamin D deficiency, unspecified: Secondary | ICD-10-CM

## 2015-02-03 DIAGNOSIS — R413 Other amnesia: Secondary | ICD-10-CM

## 2015-02-03 DIAGNOSIS — R9082 White matter disease, unspecified: Secondary | ICD-10-CM

## 2015-02-03 MED ORDER — DONEPEZIL HCL 10 MG PO TABS
10.0000 mg | ORAL_TABLET | Freq: Every day | ORAL | Status: DC
Start: 2015-02-03 — End: 2015-06-26

## 2015-02-03 NOTE — Patient Instructions (Addendum)
I think overall you are doing fairly well but I do want to suggest a few things today:  No new medications.   Remember to drink plenty of fluid, eat healthy meals and do not skip any meals. Try to eat protein with a every meal and eat a healthy snack such as fruit or nuts in between meals. Try to keep a regular sleep-wake schedule and try to exercise daily, particularly in the form of walking, 20-30 minutes a day, if you can. Good nutrition, proper sleep and exercise can help her cognitive function.  Engage in social activities in your community and with your family and try to keep up with current events by reading the newspaper or watching the news. If you have computer and can go online, try [email or web address]. Also, you may like to do word finding puzzles or crossword puzzles.  As far as your medications are concerned, I would like to suggest no changes and we will request a repeat brain MRI and a carotid ultrasound test and we will call you with the results.   I would like to see you back in 6 months, sooner if we need to. Please call us with any interim questions, concerns, problems, updates or refill requests.  Our phone number is [phone]. We also have an after hours call service for urgent matters and there is a physician on-call for urgent questions. For any emergencies you know to call 911 or go to the nearest emergency room.

## 2015-02-03 NOTE — Progress Notes (Signed)
Subjective:    Patient ID: Nicole Ward is a 58 y.o. female.  HPI     Interim history:   Nicole Ward is a 59 year old right-handed woman with an underlying medical history of hypothyroidism, hypertension, recent diagnosis of type 2 diabetes, hyperlipidemia, and depression, who presents for follow-up consultation of her memory loss. She is accompanied by her sister again today. I first met her on 08/05/2014 at the request of her primary care provider, at which time she reported a history of memory loss for the past 2 years. She was on Aricept 10 mg for about 2 years. Her MMSE was 26 out of 30 at the time, clock drawing was 4 out of 4, animal fluency was 11.  I ordered a repeat brain MRI and blood work. I did not suggest any new medications. Blood work from 08/05/2014 showed a normal RPR, normal B12, normal TSH, vitamin D was low at 10.4 and hemoglobin A1c was elevated at 6.4. We called her with her test results and advised her to talk to her primary care physician regarding her low vitamin D but she was advised to start an over-the-counter vitamin D supplement at 2000 units daily. She had repeat neuropsychological testing on 10/08/2014 and compared to her test results from 07/06/2013 her findings were stable. 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  Today, she reports that she established care with a new PCP. She had an increase in her lovastatin to 40 mg. She has a family history of cardiac d/s and early cardiac d/s in her younger brother. Her sister feels, her short term memory has become worse. She may not drink enough water. She has no new complaints.   She resides with her sister. She is reportedly still driving but does not feel comfortable going far by herself and has gotten lost. She had a previous neurological workup in the form of the EEG in the past which was negative according to your note. CT  head in the past showed white matter changes. She has no personal history of epilepsy or stroke or migraine headaches. She's had some recent recurrent headaches with relief with over-the-counter Advil or Aleve. She has seen Dr. Sima Matas, psychologist in the past and I reviewed his records from June 2014. His impression was memory difficulties, executive function deficits angiographic disorientation. He felt that her memory difficulties were beyond those typically seen with depression and stress and he suggested neuro cognitive testing. The patient had her neuropsychological evaluation on 06/05/2013. I reviewed the test results. The impression was that findings were consistent with those found with early-onset dementia of the Alzheimer's type. While the patient does have a generally complicated medical picture with abnormalities shown on the MRI brain that may be related to vascular changes, his impression was that of early-onset Alzheimer's disease. He recommended re-testing in 6-9 months, but this was not done.  She had a brain MRI without contrast on 05/31/2012: No acute intracranial abnormality. Multiple small subcortical white matter hyperintensities are present. These may be due to chronic microvascular ischemia, migraine headaches, vasculitis also possible. In addition, I personally reviewed the images through the PACS system. Prior to that she had a head CT without contrast on 09/15/2011: Showed no acute abnormality. In addition, I personally reviewed the images through the PACS system. Her vascular risk factors include hyperlipidemia, diabetes, hypertension, overweight state with a BMI of 28.3. She reports a lot of stress with respect to  her husband's addiction and she got divorced, but re-married her ex-husband, and lost her job, income, her home and has been divorced for 2 years.   She used to see Dr. Merlene Laughter, but d/t insurance issues, she could not go back to him.   Her MGM had some memory loss and  mother is 79 yo and has had some memory loss, but both had memory loss in their older age.   She has had some weight loss. She has anxiety and fear, but no paranoia. She worked third shift at [employer] for about 8 years and was laid off in 11/13. She now lives with her daughter who work 12 h shift and her 83 yo GD, who is in school and sometimes she feels fearful of staying at home. She is depressed, but is not on any medication. She was on Remeron, but gained.  Symptom onset was gradual and are now fairly stable. She primarily has been having difficulty with short-term memory such as forgetfulness, misplacing things, asking the same question again and forgetting dates and events. There is some report of confusion or disorientation. Familiar faces are easily recognized. She drives within 5 m radius, not at night and no interstate.   She reports some headaches.   She has a history of smoking marijuana. She is a non-smoker. She does not drink alcohol. she denies suicidal ideations or homicidal ideations.    The patient denies prior TIA or stroke symptoms, such as sudden onset of one sided weakness, numbness, tingling, slurring of speech or droopy face, hearing loss, tinnitus, diplopia or visual field cut or monocular loss of vision.  Of note, the patient denies snoring, and there is no report of witnessed apneas.   Her Past Medical History Is Significant For: Past Medical History  Diagnosis Date  . Hyperlipidemia     off meds for years  . Thyroid disease 05/2014    Patient unsure if hyper or hypo    Her Past Surgical History Is Significant For: Past Surgical History  Procedure Laterality Date  . Partial hysterectomy    . Breast biopsy      benign  . Colonoscopy  approx 2005    patient not sure of findings or where procedure done    Her Family History Is Significant For: Family History  Problem Relation Age of Onset  . Colon cancer Father     late 30s  . Liver disease Father     cirrhosis,  etoh    Her Social History Is Significant For: History   Social History  . Marital Status: Divorced    Spouse Name: N/A  . Number of Children: 1  . Years of Education: 12   Occupational History  .  [employer]   Social History Main Topics  . Smoking status: Never Smoker   . Smokeless tobacco: Not on file  . Alcohol Use: No  . Drug Use: No  . Sexual Activity: Not on file   Other Topics Concern  . None   Social History Narrative   Patient states she remarried her husband in 33. States he is in jail for cocaine use. Under a lot of stress.    Her Allergies Are:  Allergies  Allergen Reactions  . Trazodone And Nefazodone Palpitations  :   Her Current Medications Are:  Outpatient Encounter Prescriptions as of 02/03/2015  Medication Sig  . donepezil (ARICEPT) 10 MG tablet Take 10 mg by mouth at bedtime.  Marland Kitchen glipiZIDE (GLUCOTROL) 5 MG tablet Take  by mouth daily before breakfast.  . levothyroxine (SYNTHROID, LEVOTHROID) 25 MCG tablet Take 25 mcg by mouth daily before breakfast.  . lisinopril (PRINIVIL,ZESTRIL) 10 MG tablet Take 10 mg by mouth daily.  Marland Kitchen lovastatin (MEVACOR) 20 MG tablet Take 20 mg by mouth at bedtime.  :  Review of Systems:  Out of a complete 14 point review of systems, all are reviewed and negative with the exception of these symptoms as listed below:   Review of Systems  Neurological:       Increased memory loss    Objective:  Neurologic Exam  Physical Exam Physical Examination:   Filed Vitals:   02/03/15 0935  BP: 113/69  Pulse: 69  Temp: 97.3 F (36.3 C)  Resp: 14    General Examination: The patient is a very pleasant 59 y.o. female in no acute distress. She is calm and cooperative with the exam. She denies Auditory Hallucinations and Visual Hallucinations. She is well groomed and situated in a chair. She is in good spirits today.    HEENT: Normocephalic, atraumatic, pupils are equal, round and reactive to light and accommodation.  Funduscopic exam is normal with sharp disc margins noted. Extraocular tracking shows saccadic breakdown without nystagmus noted. Hearing is intact. Face is symmetric with no facial masking and normal facial sensation, but she seems to have R facial tic, but she nor her sister were aware of it. There is no lip, neck or jaw tremor. Neck is not rigid with intact passive ROM. There are no carotid bruits on auscultation. Oropharynx exam reveals mild mouth dryness. No significant airway crowding is noted. Mallampati is class II. Tongue protrudes centrally and palate elevates symmetrically.    Chest: is clear to auscultation without wheezing, rhonchi or crackles noted.  Heart: sounds are regular and normal without murmurs, rubs or gallops noted.   Abdomen: is soft, non-tender and non-distended with normal bowel sounds appreciated on auscultation.  Extremities: There is no pitting edema in the distal lower extremities bilaterally. Pedal pulses are intact.   Skin: is warm and dry with no trophic changes noted. Age-related changes are noted on the skin.   Musculoskeletal: exam reveals no obvious joint deformities, tenderness or joint swelling or erythema.   Neurologically:  Mental status: The patient is awake and alert, paying good  attention. She is able to completely provide the history. Her sister provides details. She is oriented to: person, place, time/date, situation, day of week, month of year and year. Her memory, attention, language and knowledge are impaired. There is no aphasia, agnosia, apraxia or anomia. There is a mild degree of bradyphrenia. Speech is mildly hypophonic with no dysarthria noted. Mood is depressed and affect is constricted.   On 08/05/14: Her MMSE (Mini-Mental state exam) score is 26/30. CDT (Clock Drawing Test) score is 4/4. AFT (Animal Fluency Test) score is 11. Her geriatric depression score is 8.  On 02/03/2015: MMSE: 25/30, CDT: 4/4, AFT: 21/min, GDS: 7/15.  Cranial nerves  are as described above under HEENT exam. In addition, shoulder shrug is normal with equal shoulder height noted.  Motor exam: Normal bulk, and strength for age is noted. Tone is not rigid with absence of cogwheeling in the extremities. There is overall no bradykinesia. There is no drift or rebound. There is no tremor. Romberg is negative. Reflexes are 2+ in the upper extremities and 2+ in the lower extremities. Toes are downgoing bilaterally. Fine motor skills: Finger taps, hand movements, and rapid alternating patting are Not impaired bilaterally.  Foot taps and foot agility are Not impaired bilaterally.   Cerebellar testing shows no dysmetria or intention tremor on finger to nose testing. Heel to shin is unremarkable. There is no truncal or gait ataxia.   Sensory exam is intact to light touch, pinprick, vibration, temperature sense and proprioception in the upper and lower extremities.   Gait, station and balance: She stands up from the seated position with no significant difficulty. No veering to one side is noted. No leaning to one side. Posture is Age-appropriate. Stance is narrow-based. She turns in 2 steps. Tandem walk is possible After initial hesitation . Balance is Not particularly impaired.   Assessment and Plan:   In summary, DMIYAH LISCANO is a very pleasant 60 year old female with an underlying medical history of hypothyroidism, hypertension, recent diagnosis of type 2 diabetes, hyperlipidemia, and depression, who has had progressive memory loss for the past 2 years. She is currently on Aricept 10 mg strength and has been on it for about 2 1/2 years. 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 She is stable from my end of things. She had vascular changes on her brain MRI in the past. I ordered another scan, but the insurance denied it at the time. Her physical  exam is nonfocal currently. I would like to do the brain MRI for comparison and we talked about her recent test results, including her blood test results. She is now on vitamin D supplements. She is established with a new primary care physician. She had an increase in her lovastatin dose. I would like to also add a carotid Doppler testing today. Her sister had an abnormality in her carotid arteries. We will call her with her test results. I would like for her to continue with donepezil 10 mg strength. We will not add another medication at this time. I again had a long chat with the patient and [name] about my findings and the diagnosis of memory loss and dementia, its prognosis and treatment options. Implications of diagnosis explained at length with the patient and caregiver.. We talked about medical treatments and non-pharmacological approaches. We talked about maintaining a healthy lifestyle in general and staying active mentally and physically. I encouraged the patient to eat healthy, exercise daily and keep well hydrated, to keep a scheduled bedtime and wake time routine, to not skip any meals and eat healthy snacks in between meals and to have protein with every meal. I stressed the importance of regular exercise, within of course the patient's own mobility limitations. I encouraged the patient to keep up with current events by reading the news paper or watching the news and to do word puzzles, or if feasible, to go on [email or web address].   As far as further diagnostic testing is concerned, I suggested the following: MRI brain without contrast, C. Doppler test. I would like to see her back in about 6 months for reevaluation. .  I answered all their questions today and the patient and her sister were in agreement with the above outlined plan.they are encouraged to call with any interim questions or concerns.

## 2015-02-13 ENCOUNTER — Ambulatory Visit (INDEPENDENT_AMBULATORY_CARE_PROVIDER_SITE_OTHER): Payer: Medicare Other

## 2015-02-13 DIAGNOSIS — R413 Other amnesia: Secondary | ICD-10-CM | POA: Diagnosis not present

## 2015-02-13 DIAGNOSIS — R9082 White matter disease, unspecified: Secondary | ICD-10-CM

## 2015-02-13 DIAGNOSIS — R930 Abnormal findings on diagnostic imaging of skull and head, not elsewhere classified: Secondary | ICD-10-CM

## 2015-02-13 DIAGNOSIS — E559 Vitamin D deficiency, unspecified: Secondary | ICD-10-CM

## 2015-03-25 ENCOUNTER — Other Ambulatory Visit (HOSPITAL_COMMUNITY): Payer: Self-pay | Admitting: Adult Health Nurse Practitioner

## 2015-03-25 DIAGNOSIS — R52 Pain, unspecified: Secondary | ICD-10-CM

## 2015-04-15 ENCOUNTER — Other Ambulatory Visit (HOSPITAL_COMMUNITY): Payer: Self-pay | Admitting: Adult Health Nurse Practitioner

## 2015-04-15 ENCOUNTER — Ambulatory Visit (HOSPITAL_COMMUNITY)
Admission: RE | Admit: 2015-04-15 | Discharge: 2015-04-15 | Disposition: A | Discharge status: Home / Self Care | Payer: Medicare Other | Source: Ambulatory Visit | Attending: Adult Health Nurse Practitioner | Admitting: Adult Health Nurse Practitioner

## 2015-04-15 DIAGNOSIS — N644 Mastodynia: Secondary | ICD-10-CM | POA: Diagnosis present

## 2015-04-15 DIAGNOSIS — R52 Pain, unspecified: Secondary | ICD-10-CM

## 2015-05-15 DIAGNOSIS — I10 Essential (primary) hypertension: Secondary | ICD-10-CM | POA: Insufficient documentation

## 2015-05-15 DIAGNOSIS — F0150 Vascular dementia without behavioral disturbance: Secondary | ICD-10-CM | POA: Insufficient documentation

## 2015-05-15 DIAGNOSIS — E119 Type 2 diabetes mellitus without complications: Secondary | ICD-10-CM | POA: Insufficient documentation

## 2015-06-23 ENCOUNTER — Telehealth: Payer: Self-pay | Admitting: *Deleted

## 2015-06-23 NOTE — Telephone Encounter (Signed)
LMVM for sister of pt, [name], about MRI brain ordered from last visit.   I do not see results of this, but I do see doppler results.   Asked for call back.

## 2015-06-24 ENCOUNTER — Ambulatory Visit: Payer: Medicare Other | Admitting: Nurse Practitioner

## 2015-06-24 NOTE — Telephone Encounter (Signed)
[facility] and [facility] of [facility].  I relayed that will need MRI prior to appt.  Order still good, [facility] [phone] to call and set up. She will call.  She stated that pts insurance Medicaid would not pt.  Pt has MCR and medicaid.  I told her to give the information to [facility] and they would be able to help her with this.

## 2015-06-25 ENCOUNTER — Other Ambulatory Visit: Payer: Self-pay

## 2015-06-25 ENCOUNTER — Ambulatory Visit
Admission: RE | Admit: 2015-06-25 | Discharge: 2015-06-25 | Disposition: A | Discharge status: Home / Self Care | Payer: Medicare Other | Source: Ambulatory Visit | Attending: Neurology | Admitting: Neurology

## 2015-06-25 DIAGNOSIS — R9082 White matter disease, unspecified: Secondary | ICD-10-CM

## 2015-06-25 DIAGNOSIS — F0280 Dementia in other diseases classified elsewhere without behavioral disturbance: Secondary | ICD-10-CM

## 2015-06-25 DIAGNOSIS — F32A Depression, unspecified: Secondary | ICD-10-CM

## 2015-06-25 DIAGNOSIS — F411 Generalized anxiety disorder: Secondary | ICD-10-CM

## 2015-06-25 DIAGNOSIS — G309 Alzheimer's disease, unspecified: Principal | ICD-10-CM

## 2015-06-25 DIAGNOSIS — F329 Major depressive disorder, single episode, unspecified: Secondary | ICD-10-CM

## 2015-06-26 ENCOUNTER — Ambulatory Visit (INDEPENDENT_AMBULATORY_CARE_PROVIDER_SITE_OTHER): Payer: Medicare Other | Admitting: Nurse Practitioner

## 2015-06-26 ENCOUNTER — Encounter: Payer: Self-pay | Admitting: Nurse Practitioner

## 2015-06-26 VITALS — BP 107/67 | HR 72 | Ht 66.0 in | Wt 142.8 lb

## 2015-06-26 DIAGNOSIS — F0151 Vascular dementia with behavioral disturbance: Secondary | ICD-10-CM | POA: Diagnosis not present

## 2015-06-26 DIAGNOSIS — R930 Abnormal findings on diagnostic imaging of skull and head, not elsewhere classified: Secondary | ICD-10-CM | POA: Diagnosis not present

## 2015-06-26 DIAGNOSIS — R413 Other amnesia: Secondary | ICD-10-CM | POA: Diagnosis not present

## 2015-06-26 DIAGNOSIS — R9082 White matter disease, unspecified: Secondary | ICD-10-CM

## 2015-06-26 DIAGNOSIS — G309 Alzheimer's disease, unspecified: Secondary | ICD-10-CM | POA: Diagnosis not present

## 2015-06-26 DIAGNOSIS — E559 Vitamin D deficiency, unspecified: Secondary | ICD-10-CM

## 2015-06-26 DIAGNOSIS — F0280 Dementia in other diseases classified elsewhere without behavioral disturbance: Secondary | ICD-10-CM

## 2015-06-26 MED ORDER — DONEPEZIL HCL 10 MG PO TABS: 10.0000 mg | ORAL_TABLET | Freq: Every day | ORAL | Status: DC

## 2015-06-26 NOTE — Patient Instructions (Signed)
Continue Aricept at current dose will refill Find resources in [facility] for day programs Follow-up in 6 months

## 2015-06-26 NOTE — Progress Notes (Addendum)
[facility]  PATIENT: Nicole Ward DOB: 01-Jul-1956   REASON FOR VISIT: Follow-up for memory loss HISTORY FROM: Patient and sister    HISTORY OF PRESENT ILLNESS: Nicole Ward, 59 year old female returns for follow-up she has a history of memory loss which is been going on for several years. She lives with her daughter who works during the day. She is fairly isolated at home. She only drives short  distances. She was recently diagnosed with diabetes and  is very inconsistent with meals. Neuropsych testing in the past has revealed findings consistent with early onset dementia of Alzheimer's type. Recent MRI of the brain done 06/25/2015 shows mild atrophy no acute findings and no change from 05/31/2012. Carotid Dopplers were negative for significant stenosis. She is currently on Aricept tolerating the medication without side effects. She stays in the house all day while her daughter is working. She returns for reevaluation     HISTORY:Today, she reports that she established care with a new PCP. She had an increase in her lovastatin to 40 mg. She has a family history of cardiac d/s and early cardiac d/s in her younger brother. Her sister feels, her short term memory has become worse. She may not drink enough water. She has no new complaints.   She resides with her sister. She is reportedly still driving but does not feel comfortable going far by herself and has gotten lost. She had a previous neurological workup in the form of the EEG in the past which was negative according to your note. CT head in the past showed white matter changes. She has no personal history of epilepsy or stroke or migraine headaches. She's had some recent recurrent headaches with relief with over-the-counter Advil or Aleve. She has seen Dr. Kieth Brightly, psychologist in the past and I reviewed his records from June 2014. His impression was memory difficulties, executive function deficits angiographic  disorientation. He felt that her memory difficulties were beyond those typically seen with depression and stress and he suggested neuro cognitive testing. The patient had her neuropsychological evaluation on 06/05/2013. I reviewed the test results. The impression was that findings were consistent with those found with early-onset dementia of the Alzheimer's type. While the patient does have a generally complicated medical picture with abnormalities shown on the MRI brain that may be related to vascular changes, his impression was that of early-onset Alzheimer's disease. He recommended re-testing in 6-9 months, but this was not done.  She had a brain MRI without contrast on 05/31/2012: No acute intracranial abnormality. Multiple small subcortical white matter hyperintensities are present. These may be due to chronic microvascular ischemia, migraine headaches, vasculitis also possible. In addition, I personally reviewed the images through the PACS system. Prior to that she had a head CT without contrast on 09/15/2011: Showed no acute abnormality. In addition, I personally reviewed the images through the PACS system. Her vascular risk factors include hyperlipidemia, diabetes, hypertension, overweight state with a BMI of 28.3. She reports a lot of stress with respect to her husband's addiction and she got divorced, but re-married her ex-husband, and lost her job, income, her home and has been divorced for 2 years.  She used to see Dr. Gerilyn Pilgrim, but d/t insurance issues, she could not go back to him.  Her MGM had some memory loss and mother is 51 yo and has had some memory loss, but both had memory loss in their older age.  She has had some weight loss. She has anxiety and  fear, but no paranoia. She worked third shift at [employer] for about 8 years and was laid off in 11/13. She now lives with her daughter who work 12 h shift and her 78 yo GD, who is in school and sometimes she feels fearful of staying at home.  She is depressed, but is not on any medication. She was on Remeron, but gained.  Symptom onset was gradual and are now fairly stable. She primarily has been having difficulty with short-term memory such as forgetfulness, misplacing things, asking the same question again and forgetting dates and events. There is some report of confusion or disorientation. Familiar faces are easily recognized. She drives within 5 m radius, not at night and no interstate.  She reports some headaches.  She has a history of smoking marijuana. She is a non-smoker. She does not drink alcohol. she denies suicidal ideations or homicidal ideations.  The patient denies prior TIA or stroke symptoms, such as sudden onset of one sided weakness, numbness, tingling, slurring of speech or droopy face, hearing loss, tinnitus, diplopia or visual field cut or monocular loss of vision.  Of note, the patient denies snoring, and there is no report of witnessed apneas.    REVIEW OF SYSTEMS: Full 14 system review of systems performed and notable only for those listed, all others are neg:  Constitutional: neg  Cardiovascular: neg Ear/Nose/Throat: neg  Skin: neg Eyes: Blurred vision Respiratory: neg Gastroitestinal: neg  Hematology/Lymphatic: neg  Endocrine: neg Musculoskeletal:neg Allergy/Immunology: neg Neurological: Memory loss Psychiatric: Confusion, anxiety Sleep : neg   ALLERGIES: Allergies  Allergen Reactions  . Trazodone And Nefazodone Palpitations    HOME MEDICATIONS: Outpatient Prescriptions Prior to Visit  Medication Sig Dispense Refill  . donepezil (ARICEPT) 10 MG tablet Take 1 tablet (10 mg total) by mouth at bedtime. 30 tablet 5  . glipiZIDE (GLUCOTROL) 5 MG tablet Take by mouth daily before breakfast.    . levothyroxine (SYNTHROID, LEVOTHROID) 25 MCG tablet Take 25 mcg by mouth daily before breakfast.    . lisinopril (PRINIVIL,ZESTRIL) 10 MG tablet Take 10 mg by mouth daily.    Marland Kitchen lovastatin (MEVACOR)  20 MG tablet Take 20 mg by mouth at bedtime.     No facility-administered medications prior to visit.    PAST MEDICAL HISTORY: Past Medical History  Diagnosis Date  . Hyperlipidemia     off meds for years  . Thyroid disease 05/2014    Patient unsure if hyper or hypo  . Memory loss     PAST SURGICAL HISTORY: Past Surgical History  Procedure Laterality Date  . Partial hysterectomy    . Breast biopsy      benign  . Colonoscopy  approx 2005    patient not sure of findings or where procedure done    FAMILY HISTORY: Family History  Problem Relation Age of Onset  . Colon cancer Father     late 46s  . Liver disease Father     cirrhosis, etoh    SOCIAL HISTORY: History   Social History  . Marital Status: Divorced    Spouse Name: N/A  . Number of Children: 1  . Years of Education: 12   Occupational History  .  [employer]   Social History Main Topics  . Smoking status: Never Smoker   . Smokeless tobacco: Not on file  . Alcohol Use: No  . Drug Use: No  . Sexual Activity: Not on file   Other Topics Concern  . Not on file  Social History Narrative   Patient states she remarried her husband in 6. States he is in jail for cocaine use. Under a lot of stress.     PHYSICAL EXAM  Filed Vitals:   06/26/15 1111  BP: 107/67  Pulse: 72  Height: 5\' 6"  (1.676 m)  Weight: 142 lb 12.8 oz (64.774 kg)   Body mass index is 23.06 kg/(m^2).  Generalized: Well developed, in no acute distress  Head: normocephalic and atraumatic,. Oropharynx benign  Neck: Supple, no carotid bruits  Cardiac: Regular rate rhythm, no murmur  Musculoskeletal: No deformity   Neurological examination   Mentation: Alert oriented to time, place, history taking. Attention span and concentration appropriate. MMSE 25/30 missing 3 of 3 recall. AFT 12 clock drawing 4/4   Follows all commands speech and language fluent.  Cranial nerve II-XII: Pupils were equal round reactive to light extraocular  movements were full, visual field were full on confrontational test. Facial sensation and strength were normal. hearing was intact to finger rubbing bilaterally. Uvula tongue midline. head turning and shoulder shrug were normal and symmetric.Tongue protrusion into cheek strength was normal. Motor: normal bulk and tone, full strength in the BUE, BLE, fine finger movements normal, no pronator drift. No focal weakness Sensory: normal and symmetric to light touch, pinprick, and  Vibration, proprioception  Coordination: finger-nose-finger, heel-to-shin bilaterally, no dysmetria Reflexes: Brachioradialis 2/2, biceps 2/2, triceps 2/2, patellar 2/2, Achilles 2/2, plantar responses were flexor bilaterally. Gait and Station: Rising up from seated position without assistance, normal stance,  moderate stride, good arm swing, smooth turning, able to perform tiptoe, and heel walking without difficulty. Tandem gait is steady  DIAGNOSTIC DATA (LABS, IMAGING, TESTING) -  ASSESSMENT AND PLAN  59 y.o. year old female  has a past medical history of Hyperlipidemia; Thyroid disease (05/2014); and Memory loss. here to follow-up. Recent MRI of the brain with mild atrophy no acute findings and no change from 05/31/2012. Carotid Doppler was negative for significant stenosis  Continue Aricept at current dose will refill Sister to find resources in [facility] for day programs I encouraged the patient to eat healthy, exercise daily and keep well hydrated, to keep a scheduled bedtime and wake time routine, to not skip any meals and eat healthy snacks in between meals and to have protein with every meal. I stressed the importance of regular exercise, within of course the patient's own mobility limitations. I encouraged the patient to keep up with current events by reading the news paper or watching the news and to do word puzzles.  Follow-up in 6 months Vst time 25 min Nilda Riggs, [facility], [facility], APRN  [facility] [address], [address] [place], [place] [address] [phone]  I reviewed the above note and documentation by the Nurse Practitioner and agree with the history, physical exam, assessment and plan as outlined above. I was immediately available for face-to-face consultation. [name], MD, PhD [facility])

## 2015-07-08 ENCOUNTER — Ambulatory Visit: Payer: Medicare Other | Admitting: Neurology

## 2015-08-04 ENCOUNTER — Ambulatory Visit: Payer: Medicare Other | Admitting: Neurology

## 2015-09-15 DIAGNOSIS — E039 Hypothyroidism, unspecified: Secondary | ICD-10-CM | POA: Insufficient documentation

## 2015-12-16 DIAGNOSIS — E785 Hyperlipidemia, unspecified: Secondary | ICD-10-CM | POA: Insufficient documentation

## 2015-12-30 ENCOUNTER — Ambulatory Visit (INDEPENDENT_AMBULATORY_CARE_PROVIDER_SITE_OTHER): Payer: Medicare HMO | Admitting: Nurse Practitioner

## 2015-12-30 ENCOUNTER — Encounter: Payer: Self-pay | Admitting: Nurse Practitioner

## 2015-12-30 VITALS — BP 136/68 | HR 73 | Ht 66.0 in | Wt 140.5 lb

## 2015-12-30 DIAGNOSIS — R413 Other amnesia: Secondary | ICD-10-CM | POA: Diagnosis not present

## 2015-12-30 DIAGNOSIS — F0150 Vascular dementia without behavioral disturbance: Secondary | ICD-10-CM | POA: Diagnosis not present

## 2015-12-30 DIAGNOSIS — R930 Abnormal findings on diagnostic imaging of skull and head, not elsewhere classified: Secondary | ICD-10-CM

## 2015-12-30 DIAGNOSIS — G309 Alzheimer's disease, unspecified: Secondary | ICD-10-CM

## 2015-12-30 DIAGNOSIS — I10 Essential (primary) hypertension: Secondary | ICD-10-CM | POA: Diagnosis not present

## 2015-12-30 DIAGNOSIS — F0280 Dementia in other diseases classified elsewhere without behavioral disturbance: Secondary | ICD-10-CM

## 2015-12-30 DIAGNOSIS — E559 Vitamin D deficiency, unspecified: Secondary | ICD-10-CM

## 2015-12-30 DIAGNOSIS — R9082 White matter disease, unspecified: Secondary | ICD-10-CM

## 2015-12-30 MED ORDER — DONEPEZIL HCL 10 MG PO TABS: 10.0000 mg | ORAL_TABLET | Freq: Every day | ORAL | Status: DC

## 2015-12-30 MED ORDER — MEMANTINE HCL 10 MG PO TABS: 10.0000 mg | ORAL_TABLET | Freq: Two times a day (BID) | ORAL | Status: DC

## 2015-12-30 NOTE — Progress Notes (Addendum)
[facility]  PATIENT: Nicole Ward DOB: [birth year]   REASON FOR VISIT: Follow-up for Alzheimer's disease, white matter abnormality on MRI, vascular dementia  HISTORY FROM: Patient and daughter Deanna Artis    HISTORY OF PRESENT ILLNESS:UPDATE 12/30/15 Nicole Ward, 60 year old female returns for follow-up she has a history of memory loss which is been going on for several years. She lives with her daughter who works during the day. She is fairly isolated at home. She only drives short distances. She was recently diagnosed with diabetes and is very inconsistent with meals. Neuropsych testing in the past has revealed findings consistent with early onset dementia of Alzheimer's type. Recent MRI of the brain done 06/25/2015 shows mild atrophy no acute findings and no change from 05/31/2012. Carotid Dopplers were negative for significant stenosis. She is currently on Aricept tolerating the medication without side effects. Her primary care started Namenda since last seen. MMSE today 2730. No safety issues identified according to the daughter .She stays in the house all day while her daughter is working. She returns for reevaluation     HISTORY:Today, she reports that she established care with a new PCP. She had an increase in her lovastatin to 40 mg. She has a family history of cardiac d/s and early cardiac d/s in her younger brother. Her sister feels, her short term memory has become worse. She may not drink enough water. She has no new complaints.   She resides with her sister. She is reportedly still driving but does not feel comfortable going far by herself and has gotten lost. She had a previous neurological workup in the form of the EEG in the past which was negative according to your note. CT head in the past showed white matter changes. She has no personal history of epilepsy or stroke or migraine headaches. She's had some recent recurrent headaches with relief with  over-the-counter Advil or Aleve. She has seen Dr. Kieth Brightly, psychologist in the past and I reviewed his records from June 2014. His impression was memory difficulties, executive function deficits angiographic disorientation. He felt that her memory difficulties were beyond those typically seen with depression and stress and he suggested neuro cognitive testing. The patient had her neuropsychological evaluation on 06/05/2013. I reviewed the test results. The impression was that findings were consistent with those found with early-onset dementia of the Alzheimer's type. While the patient does have a generally complicated medical picture with abnormalities shown on the MRI brain that may be related to vascular changes, his impression was that of early-onset Alzheimer's disease. He recommended re-testing in 6-9 months, but this was not done.  She had a brain MRI without contrast on 05/31/2012: No acute intracranial abnormality. Multiple small subcortical white matter hyperintensities are present. These may be due to chronic microvascular ischemia, migraine headaches, vasculitis also possible. In addition, I personally reviewed the images through the PACS system. Prior to that she had a head CT without contrast on 09/15/2011: Showed no acute abnormality. In addition, I personally reviewed the images through the PACS system. Her vascular risk factors include hyperlipidemia, diabetes, hypertension, overweight state with a BMI of 28.3. She reports a lot of stress with respect to her husband's addiction and she got divorced, but re-married her ex-husband, and lost her job, income, her home and has been divorced for 2 years.  She used to see Dr. Gerilyn Pilgrim, but d/t insurance issues, she could not go back to him.  Her MGM had some memory loss and mother is 48  yo and has had some memory loss, but both had memory loss in their older age.  She has had some weight loss. She has anxiety and fear, but no paranoia. She  worked third shift at [employer] for about 8 years and was laid off in 11/13. She now lives with her daughter who work 12 h shift and her 8 yo GD, who is in school and sometimes she feels fearful of staying at home. She is depressed, but is not on any medication. She was on Remeron, but gained.  Symptom onset was gradual and are now fairly stable. She primarily has been having difficulty with short-term memory such as forgetfulness, misplacing things, asking the same question again and forgetting dates and events. There is some report of confusion or disorientation. Familiar faces are easily recognized. She drives within 5 m radius, not at night and no interstate.  She reports some headaches.  She has a history of smoking marijuana. She is a non-smoker. She does not drink alcohol. she denies suicidal ideations or homicidal ideations.  The patient denies prior TIA or stroke symptoms, such as sudden onset of one sided weakness, numbness, tingling, slurring of speech or droopy face, hearing loss, tinnitus, diplopia or visual field cut or monocular loss of vision.  Of note, the patient denies snoring, and there is no report of witnessed apneas.     REVIEW OF SYSTEMS: Full 14 system review of systems performed and notable only for those listed, all others are neg:  Constitutional: Fatigue Cardiovascular: neg Ear/Nose/Throat: neg  Skin: neg Eyes: neg Respiratory: neg Gastroitestinal: neg  Hematology/Lymphatic: neg  Endocrine: Intolerance to cold Musculoskeletal: Muscle cramps Allergy/Immunology: neg Neurological: Memory loss Psychiatric: neg Sleep : neg   ALLERGIES: Allergies  Allergen Reactions  . Trazodone And Nefazodone Palpitations    HOME MEDICATIONS: Outpatient Prescriptions Prior to Visit  Medication Sig Dispense Refill  . atorvastatin (LIPITOR) 40 MG tablet Take 40 mg by mouth.    . donepezil (ARICEPT) 10 MG tablet Take 1 tablet (10 mg total) by mouth at bedtime. 30 tablet 5   . levothyroxine (SYNTHROID, LEVOTHROID) 25 MCG tablet Take 25 mcg by mouth daily before breakfast.    . lisinopril (PRINIVIL,ZESTRIL) 10 MG tablet Take 10 mg by mouth daily.    Marland Kitchen glipiZIDE (GLUCOTROL) 5 MG tablet Take by mouth daily before breakfast.     No facility-administered medications prior to visit.    PAST MEDICAL HISTORY: Past Medical History  Diagnosis Date  . Hyperlipidemia     off meds for years  . Thyroid disease 05/2014    Patient unsure if hyper or hypo  . Memory loss     PAST SURGICAL HISTORY: Past Surgical History  Procedure Laterality Date  . Partial hysterectomy    . Breast biopsy      benign  . Colonoscopy  approx 2005    patient not sure of findings or where procedure done    FAMILY HISTORY: Family History  Problem Relation Age of Onset  . Colon cancer Father     late 37s  . Liver disease Father     cirrhosis, etoh    SOCIAL HISTORY: Social History   Social History  . Marital Status: Divorced    Spouse Name: N/A  . Number of Children: 1  . Years of Education: 12   Occupational History  .  [employer]   Social History Main Topics  . Smoking status: Never Smoker   . Smokeless tobacco: Not on file  .  Alcohol Use: No  . Drug Use: No  . Sexual Activity: Not on file   Other Topics Concern  . Not on file   Social History Narrative   Patient states she remarried her husband in 71. States he is in jail for cocaine use. Under a lot of stress.     PHYSICAL EXAM  Filed Vitals:   12/30/15 1011  BP: 136/68  Pulse: 73  Height:  (1.676 m)  Weight: 140 lb 8 oz (63.73 kg)   Body mass index is 22.69 kg/(m^2). Generalized: Well developed, in no acute distress  Head: normocephalic and atraumatic,. Oropharynx benign  Neck: Supple, no carotid bruits  Cardiac: Regular rate rhythm, no murmur  Musculoskeletal: No deformity   Neurological examination   Mentation: Alert oriented to time, place, history taking. Attention span and  concentration appropriate. MMSE 27/30 missing 1 of 3 recall. AFT 13 clock drawing 4/4 Follows all commands speech and language fluent.  Cranial nerve II-XII: Pupils were equal round reactive to light extraocular movements were full, visual field were full on confrontational test. Facial sensation and strength were normal. hearing was intact to finger rubbing bilaterally. Uvula tongue midline. head turning and shoulder shrug were normal and symmetric.Tongue protrusion into cheek strength was normal. Motor: normal bulk and tone, full strength in the BUE, BLE, fine finger movements normal, no pronator drift. No focal weakness Sensory: normal and symmetric to light touch, pinprick, and Vibration, proprioception  Coordination: finger-nose-finger, heel-to-shin bilaterally, no dysmetria Reflexes: Brachioradialis 2/2, biceps 2/2, triceps 2/2, patellar 2/2, Achilles 2/2, plantar responses were flexor bilaterally. Gait and Station: Rising up from seated position without assistance, normal stance, moderate stride, good arm swing, smooth turning, able to perform tiptoe, and heel walking without difficulty. Tandem gait is steady, no assistive device    DIAGNOSTIC DATA (LABS, IMAGING, TESTING) -  ASSESSMENT AND PLAN 60 y.o. year old female has a past medical history of Hyperlipidemia; and Memory loss. here to follow-up. Recent MRI of the brain with mild atrophy no acute findings and no change from 05/31/2012.The patient is a current patient of Dr. Frances Furbish  who is out of the office today . This note is sent to the work in doctor.      Continue Aricept at current dose will refill Continue Namenda 10 mg twice a day will refill Continue to do much memory exercises I encouraged the patient to eat healthy, exercise daily and keep well hydrated, to keep a scheduled bedtime and wake time routine, to not skip any meals and eat healthy snacks in between meals and to have protein with every meal. I stressed the  importance of regular exercise, within of course the patient's own mobility limitations. I encouraged the patient to keep up with current events by reading the news paper or watching the news and to do word puzzles.  Follow-up in 6 months,Next visit with Dr. Frances Furbish  Vst time 20 min Nilda Riggs, [facility], [facility], APRN  [facility] [address], [address] [place], [place] [address] [phone]  Personally examined images, and have participated in and made any corrections needed to history, physical, neuro exam,assessment and plan as stated above.  I have personally reviewed the history, evaluated lab date, reviewed imaging studies and agree with radiology interpretations.    Naomie Dean, MD [phone] [facility]

## 2015-12-30 NOTE — Patient Instructions (Signed)
Continue Aricept at current dose will refill Continue Namenda 10 mg twice a day will refill Continue to do much memory exercises Follow-up in 6 months next visit with Dr. Frances Furbish

## 2016-01-13 ENCOUNTER — Other Ambulatory Visit: Payer: Self-pay | Admitting: Neurology

## 2016-02-14 ENCOUNTER — Other Ambulatory Visit: Payer: Self-pay | Admitting: Neurology

## 2016-06-20 ENCOUNTER — Encounter (HOSPITAL_COMMUNITY): Payer: Self-pay | Admitting: *Deleted

## 2016-06-20 ENCOUNTER — Emergency Department (HOSPITAL_COMMUNITY): Payer: Medicare HMO

## 2016-06-20 ENCOUNTER — Emergency Department (HOSPITAL_COMMUNITY)
Admission: EM | Admit: 2016-06-20 | Discharge: 2016-06-20 | Disposition: A | Discharge status: Home / Self Care | Payer: Medicare HMO | Attending: Emergency Medicine | Admitting: Emergency Medicine

## 2016-06-20 DIAGNOSIS — I10 Essential (primary) hypertension: Secondary | ICD-10-CM | POA: Insufficient documentation

## 2016-06-20 DIAGNOSIS — M5442 Lumbago with sciatica, left side: Secondary | ICD-10-CM | POA: Insufficient documentation

## 2016-06-20 DIAGNOSIS — M79605 Pain in left leg: Secondary | ICD-10-CM | POA: Diagnosis present

## 2016-06-20 DIAGNOSIS — E785 Hyperlipidemia, unspecified: Secondary | ICD-10-CM | POA: Insufficient documentation

## 2016-06-20 DIAGNOSIS — G309 Alzheimer's disease, unspecified: Secondary | ICD-10-CM | POA: Diagnosis not present

## 2016-06-20 DIAGNOSIS — M5432 Sciatica, left side: Secondary | ICD-10-CM

## 2016-06-20 HISTORY — DX: Dementia in other diseases classified elsewhere, unspecified severity, without behavioral disturbance, psychotic disturbance, mood disturbance, and anxiety: F02.80

## 2016-06-20 HISTORY — DX: Alzheimer's disease with early onset: G30.0

## 2016-06-20 HISTORY — DX: Essential (primary) hypertension: I10

## 2016-06-20 LAB — URINALYSIS, ROUTINE W REFLEX MICROSCOPIC
BILIRUBIN URINE: NEGATIVE
Glucose, UA: NEGATIVE mg/dL
HGB URINE DIPSTICK: NEGATIVE
Ketones, ur: NEGATIVE mg/dL
Leukocytes, UA: NEGATIVE
Nitrite: NEGATIVE
PROTEIN: NEGATIVE mg/dL
Specific Gravity, Urine: 1.02 (ref 1.005–1.030)
pH: 5.5 (ref 5.0–8.0)

## 2016-06-20 MED ORDER — METHOCARBAMOL 500 MG PO TABS: 500.0000 mg | ORAL_TABLET | Freq: Three times a day (TID) | ORAL | Status: DC

## 2016-06-20 MED ORDER — DIAZEPAM 5 MG PO TABS
5.0000 mg | ORAL_TABLET | Freq: Once | ORAL | Status: AC
Start: 2016-06-20 — End: 2016-06-20
  Administered 2016-06-20: 5 mg via ORAL
  Filled 2016-06-20: qty 1

## 2016-06-20 MED ORDER — NAPROXEN 500 MG PO TABS: 500.0000 mg | ORAL_TABLET | Freq: Two times a day (BID) | ORAL | Status: DC

## 2016-06-20 MED ORDER — IBUPROFEN 800 MG PO TABS
800.0000 mg | ORAL_TABLET | Freq: Once | ORAL | Status: AC
  Administered 2016-06-20: 800 mg via ORAL
  Filled 2016-06-20: qty 1

## 2016-06-20 NOTE — ED Notes (Signed)
Pt c/o pain in her left hip that radiates down her left leg.

## 2016-06-20 NOTE — Discharge Instructions (Signed)
°  Sciatica °Sciatica is pain, weakness, numbness, or tingling along your sciatic nerve. The nerve starts in the lower back and runs down the back of each leg. Nerve damage or certain conditions pinch or put pressure on the sciatic nerve. This causes the pain, weakness, and other discomforts of sciatica. °HOME CARE  °· Only take medicine as told by your doctor. °· Apply ice to the affected area for 20 minutes. Do this 3-4 times a day for the first 48-72 hours. Then try heat in the same way. °· Exercise, stretch, or do your usual activities if these do not make your pain worse. °· Go to physical therapy as told by your doctor. °· Keep all doctor visits as told. °· Do not wear high heels or shoes that are not supportive. °· Get a firm mattress if your mattress is too soft to lessen pain and discomfort. °GET HELP RIGHT AWAY IF:  °· You cannot control when you poop (bowel movement) or pee (urinate). °· You have more weakness in your lower back, lower belly (pelvis), butt (buttocks), or legs. °· You have redness or puffiness (swelling) of your back. °· You have a burning feeling when you pee. °· You have pain that gets worse when you lie down. °· You have pain that wakes you from your sleep. °· Your pain is worse than past pain. °· Your pain lasts longer than 4 weeks. °· You are suddenly losing weight without reason. °MAKE SURE YOU:  °· Understand these instructions. °· Will watch this condition. °· Will get help right away if you are not doing well or get worse. °  °This information is not intended to replace advice given to you by your health care provider. Make sure you discuss any questions you have with your health care provider. °  °Document Released: 08/31/2008 Document Revised: 08/13/2015 Document Reviewed: 04/02/2012 °Elsevier Interactive Patient Education ©2016 [employer] ° ° °

## 2016-06-20 NOTE — ED Provider Notes (Signed)
CSN: [id number]     Arrival date & time 06/20/16  1853 History  By signing my name below, I, Doreatha Martinva Mathews, attest that this documentation has been prepared under the direction and in the presence of Wells Mabe, PA-C. Electronically Signed: Doreatha MartinEva Mathews, ED Scribe. 06/20/2016. 7:20 PM.    Chief Complaint  Patient presents with  . left leg pain    The history is provided by the patient. No language interpreter was used.   HPI Comments: Nicole Ward is a 60 y.o. female who presents to the Emergency Department complaining of moderate, intermittent, sharp left hip pain with radiation to her left thigh onset yesterday. Pt states that her pain began yesterday after moving from a seated to standing position, then improved before worsening again today after standing from a seated position tonight at a restaurant. She denies recent injury, falls or trauma. Pt states that her pain is worsened with ambulation and weight bearing. Pt denies taking OTC medications at home to improve symptoms. She also complains of urinary frequency at this time, which she attributes to her HCTZ. Pt denies numbness, paresthesia, weakness, abdominal pain, bowel or bladder incontinence, dysuria.   Past Medical History  Diagnosis Date  . Hyperlipidemia     off meds for years  . Thyroid disease 05/2014    Patient unsure if hyper or hypo  . Memory loss   . Hypertension   . Alzheimer's disease with early onset    Past Surgical History  Procedure Laterality Date  . Partial hysterectomy    . Breast biopsy      benign  . Colonoscopy  approx 2005    patient not sure of findings or where procedure done   Family History  Problem Relation Age of Onset  . Colon cancer Father     late 7650s  . Liver disease Father     cirrhosis, etoh   Social History  Substance Use Topics  . Smoking status: Never Smoker   . Smokeless tobacco: None  . Alcohol Use: No   OB History    No data available     Review of Systems   Constitutional: Negative for fever.  Respiratory: Negative for shortness of breath.   Gastrointestinal: Negative for vomiting, abdominal pain and constipation.       -bowel or bladder incontinence   Genitourinary: Positive for frequency. Negative for dysuria, urgency, hematuria, flank pain, decreased urine volume and difficulty urinating.  Musculoskeletal: Positive for myalgias (left thigh), back pain and arthralgias (left hip). Negative for joint swelling.  Skin: Negative for rash.  Neurological: Negative for weakness and numbness.  All other systems reviewed and are negative.  Allergies  Trazodone and nefazodone  Home Medications   Prior to Admission medications   Medication Sig Start Date End Date Taking? Authorizing Provider  atorvastatin (LIPITOR) 40 MG tablet Take 40 mg by mouth. 05/22/15 05/21/16  Historical Provider, MD  donepezil (ARICEPT) 10 MG tablet Take 1 tablet (10 mg total) by mouth at bedtime. 12/30/15   Nilda RiggsNancy Carolyn Martin, NP  levothyroxine (SYNTHROID, LEVOTHROID) 25 MCG tablet Take 25 mcg by mouth daily before breakfast.    Historical Provider, MD  lisinopril (PRINIVIL,ZESTRIL) 10 MG tablet Take 10 mg by mouth daily.    Historical Provider, MD  memantine (NAMENDA) 10 MG tablet Take 1 tablet (10 mg total) by mouth 2 (two) times daily. 12/30/15   Nilda RiggsNancy Carolyn Martin, NP   BP 134/72 mmHg  Pulse 79  Temp(Src) 98.7 F (37.1 C) (  Oral)  Resp 16  Ht  (1.676 m)  Wt 140 lb (63.504 kg)  BMI 22.61 kg/m2  SpO2 100% Physical Exam  Constitutional: She appears well-developed and well-nourished.  HENT:  Head: Normocephalic.  Eyes: Conjunctivae are normal.  Cardiovascular: Normal rate, regular rhythm and normal heart sounds.  Exam reveals no gallop and no friction rub.   No murmur heard. Pulmonary/Chest: Effort normal and breath sounds normal. No respiratory distress. She has no wheezes. She has no rales.  Abdominal: She exhibits no distension.  Musculoskeletal: Normal  range of motion. She exhibits tenderness.  Tenderness of the lateral left hip and left lower lumbar paraspinal muscles. Pain reproduced with SLR on the left. 5/5 strength to BLE.   Neurological: She is alert.  Skin: Skin is warm and dry.  Psychiatric: She has a normal mood and affect. Her behavior is normal.  Nursing note and vitals reviewed.   ED Course  Procedures (including critical care time) DIAGNOSTIC STUDIES: Oxygen Saturation is 100% on RA, normal by my interpretation.    COORDINATION OF CARE: 7:16 PM Discussed treatment plan with pt at bedside which includes UA and pt agreed to plan.   Labs Review Labs Reviewed  URINALYSIS, ROUTINE W REFLEX MICROSCOPIC (NOT AT [facility])    Dg Lumbar Spine Complete  06/20/2016  CLINICAL DATA:  Sharp left hip pain. EXAM: LUMBAR SPINE - COMPLETE 4+ VIEW COMPARISON:  None. FINDINGS: Normal lumbar segmentation. Negative for acute fracture, endplate erosion, or evidence focal bone lesion. Facet arthropathy with spurring most notable on the right at L2-3 and L5-S1. Negative for disc narrowing. Iliac atherosclerotic calcification IMPRESSION: 1. No acute finding or disc narrowing. 2. Facet arthropathy. Electronically Signed   By: Marnee Spring M.D.   On: 06/20/2016 22:40   Dg Hip Unilat With Pelvis 2-3 Views Left  06/20/2016  CLINICAL DATA:  60 year old female with sharp left hip pain after moving from seated to standing position. Initial encounter. EXAM: DG HIP (WITH OR WITHOUT PELVIS) 2-3V LEFT COMPARISON:  None. FINDINGS: Left hip joint degenerative changes with osteophyte. No fracture or dislocation. Right hip degenerative changes. Nonspecific right ischium/acetabulum lucency with surrounding sclerosis. This does not appear acute. IMPRESSION: Bilateral hip joint degenerative changes as noted above. Electronically Signed   By: Lacy Duverney M.D.   On: 06/20/2016 22:43     I have personally reviewed and evaluated these lab results as part of my medical  decision-making.    MDM   Final diagnoses:  Sciatica of left side    2010  Pt feeling better after ibuprofen and po valium.  Resting comfortably.  Ambulated to the restroom without difficulty.    XR discussed with the patient.  No concerning sx's for emergent neurological process.  Likely sciatica.  Pt agrees to symptomatic tx and close PMD f/u  Appears stable for d/c  I personally performed the services described in this documentation, which was scribed in my presence. The recorded information has been reviewed and is accurate.   Pauline Aus, PA-C 06/20/16 2322  Donnetta Hutching, MD 06/22/16 [phone]

## 2016-06-29 ENCOUNTER — Ambulatory Visit: Payer: Medicare HMO | Admitting: Neurology

## 2016-07-13 ENCOUNTER — Encounter: Payer: Self-pay | Admitting: Neurology

## 2016-07-13 ENCOUNTER — Ambulatory Visit (INDEPENDENT_AMBULATORY_CARE_PROVIDER_SITE_OTHER): Payer: Medicare HMO | Admitting: Neurology

## 2016-07-13 VITALS — BP 132/62 | HR 72 | Resp 14 | Ht 66.0 in | Wt 145.0 lb

## 2016-07-13 DIAGNOSIS — F39 Unspecified mood [affective] disorder: Secondary | ICD-10-CM | POA: Diagnosis not present

## 2016-07-13 DIAGNOSIS — R413 Other amnesia: Secondary | ICD-10-CM | POA: Diagnosis not present

## 2016-07-13 DIAGNOSIS — E559 Vitamin D deficiency, unspecified: Secondary | ICD-10-CM | POA: Diagnosis not present

## 2016-07-13 DIAGNOSIS — R930 Abnormal findings on diagnostic imaging of skull and head, not elsewhere classified: Secondary | ICD-10-CM

## 2016-07-13 DIAGNOSIS — F439 Reaction to severe stress, unspecified: Secondary | ICD-10-CM

## 2016-07-13 DIAGNOSIS — R9082 White matter disease, unspecified: Secondary | ICD-10-CM

## 2016-07-13 DIAGNOSIS — Z658 Other specified problems related to psychosocial circumstances: Secondary | ICD-10-CM | POA: Diagnosis not present

## 2016-07-13 MED ORDER — MEMANTINE HCL 10 MG PO TABS: 10.0000 mg | ORAL_TABLET | Freq: Two times a day (BID) | ORAL | 5 refills | Status: DC

## 2016-07-13 MED ORDER — DONEPEZIL HCL 10 MG PO TABS: 10.0000 mg | ORAL_TABLET | Freq: Every day | ORAL | 5 refills | Status: DC

## 2016-07-13 NOTE — Progress Notes (Signed)
Subjective:    Patient ID: Nicole Ward is a 60 y.o. female.  HPI     Interim history:   Nicole Ward is a 60 year old right-handed woman with an underlying medical history of hypothyroidism, hypertension, recent diagnosis of type 2 diabetes, hyperlipidemia, and depression, who presents for follow-up consultation of her memory loss. She is accompanied by her sister again today. I last saw her on 02/03/2015, at which time she reported an increase in her lovastatin to 40 mg. Her sister felt that her short-term memory was worse. She had a new PCP. I suggested we proceed with a carotid Doppler ultrasound and brain MRI. She had a ultrasound carotid bilaterally on 02/13/2015 which showed no significant stenosis involving the extracranial carotid arteries bilaterally. She had a brain MRI without contrast on 06/25/2015 which I reviewed: IMPRESSION:  Abnormal MRI brain (without) demonstrating: 1. Mild perisylvian atrophy. 2. Mild chronic small vessel ischemic disease. 3. No acute findings. 4. No significant change from MRI on 05/31/12.  She was only on monotherapy with donepezil at the time.  In the interim, she was seen by Cecille Rubin, nurse practitioner on 12/30/2015, at which time she was advised to continue with generic Namenda twice daily and generic Aricept once daily. Her MMSE was 27 out of 30 at the time.  Today, 07/13/2016: She reports doing okay, on dual therapy. Mother is 107 and exhibiting some memory loss, especially after a bout of depression when her son - patient's brother - died. She herself has had some depression on and off, and has had traumatic experiences growing up. Tolerates medications, PCP started the memantine. She has not been on an antidepressant lately, has not seen a Social worker.    Previously:   I first met her on 08/05/2014 at the request of her primary care provider, at which time she reported a history of memory loss for the past 2 years. She was on Aricept 10 mg  for about 2 years. Her MMSE was 26 out of 30 at the time, clock drawing was 4 out of 4, animal fluency was 11.  I ordered a repeat brain MRI and blood work. I did not suggest any new medications. Blood work from 08/05/2014 showed a normal RPR, normal B12, normal TSH, vitamin D was low at 10.4 and hemoglobin A1c was elevated at 6.4. We called her with her test results and advised her to talk to her primary care physician regarding her low vitamin D but she was advised to start an over-the-counter vitamin D supplement at 2000 units daily. She had repeat neuropsychological testing on 10/08/2014 and compared to her test results from 07/06/2013 her findings were stable. 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   She resides with her sister. She is reportedly still driving but does not feel comfortable going far by herself and has gotten lost. She had a previous neurological workup in the form of the EEG in the past which was negative according to your note. CT head in the past showed white matter changes. She has no personal history of epilepsy or stroke or migraine headaches. She's had some recent recurrent headaches with relief with over-the-counter Advil or Aleve. She has seen Dr. Sima Matas, psychologist in the past and I reviewed his records from June 2014. His impression was memory difficulties, executive function deficits angiographic disorientation. He felt that her memory difficulties were beyond those typically seen with depression and stress and he suggested  neuro cognitive testing. The patient had her neuropsychological evaluation on 06/05/2013. I reviewed the test results. The impression was that findings were consistent with those found with early-onset dementia of the Alzheimer's type. While the patient does have a generally complicated medical picture with abnormalities shown on the MRI brain that may be related  to vascular changes, his impression was that of early-onset Alzheimer's disease. He recommended re-testing in 6-9 months, but this was not done.  She had a brain MRI without contrast on 05/31/2012: No acute intracranial abnormality. Multiple small subcortical white matter hyperintensities are present. These may be due to chronic microvascular ischemia, migraine headaches, vasculitis also possible. In addition, I personally reviewed the images through the PACS system. Prior to that she had a head CT without contrast on 09/15/2011: Showed no acute abnormality. In addition, I personally reviewed the images through the PACS system. Her vascular risk factors include hyperlipidemia, diabetes, hypertension, overweight state with a BMI of 28.3. She reports a lot of stress with respect to her husband's addiction and she got divorced, but re-married her ex-husband, and lost her job, income, her home and has been divorced for 2 years.   She used to see Dr. Merlene Laughter, but d/t insurance issues, she could not go back to him.   Her MGM had some memory loss and mother is 22 yo and has had some memory loss, but both had memory loss in their older age.   She has had some weight loss. She has anxiety and fear, but no paranoia. She worked third shift at [employer] for about 8 years and was laid off in 11/13. She now lives with her daughter who work 12 h shift and her 61 yo GD, who is in school and sometimes she feels fearful of staying at home. She is depressed, but is not on any medication. She was on Remeron, but gained.  Symptom onset was gradual and are now fairly stable. She primarily has been having difficulty with short-term memory such as forgetfulness, misplacing things, asking the same question again and forgetting dates and events. There is some report of confusion or disorientation. Familiar faces are easily recognized. She drives within 5 m radius, not at night and no interstate.   She reports some headaches.   She has  a history of smoking marijuana. She is a non-smoker. She does not drink alcohol. she denies suicidal ideations or homicidal ideations.    The patient denies prior TIA or stroke symptoms, such as sudden onset of one sided weakness, numbness, tingling, slurring of speech or droopy face, hearing loss, tinnitus, diplopia or visual field cut or monocular loss of vision.  Of note, the patient denies snoring, and there is no report of witnessed apneas.    Her Past Medical History Is Significant For: Past Medical History:  Diagnosis Date  . Alzheimer's disease with early onset   . Hyperlipidemia    off meds for years  . Hypertension   . Memory loss   . Thyroid disease 05/2014   Patient unsure if hyper or hypo    Her Past Surgical History Is Significant For: Past Surgical History:  Procedure Laterality Date  . BREAST BIOPSY     benign  . COLONOSCOPY  approx 2005   patient not sure of findings or where procedure done  . PARTIAL HYSTERECTOMY      Her Family History Is Significant For: Family History  Problem Relation Age of Onset  . Colon cancer Father     late  69s  . Liver disease Father     cirrhosis, etoh    Her Social History Is Significant For: Social History   Social History  . Marital status: Divorced    Spouse name: N/A  . Number of children: 1  . Years of education: 55   Occupational History  .  [employer]   Social History Main Topics  . Smoking status: Never Smoker  . Smokeless tobacco: None  . Alcohol use No  . Drug use: No  . Sexual activity: Not Asked   Other Topics Concern  . None   Social History Narrative   Patient states she remarried her husband in 7. States he is in jail for cocaine use. Under a lot of stress.    Her Allergies Are:  Allergies  Allergen Reactions  . Trazodone And Nefazodone Palpitations  :   Her Current Medications Are:  Outpatient Encounter Prescriptions as of 07/13/2016  Medication Sig  . Cholecalciferol (VITAMIN D PO) Take  1 tablet by mouth daily.  Marland Kitchen donepezil (ARICEPT) 10 MG tablet Take 1 tablet (10 mg total) by mouth at bedtime.  Marland Kitchen glipiZIDE (GLUCOTROL XL) 5 MG 24 hr tablet Take 5 mg by mouth daily with breakfast.   . levothyroxine (SYNTHROID, LEVOTHROID) 25 MCG tablet Take 25 mcg by mouth daily before breakfast.  . lisinopril (PRINIVIL,ZESTRIL) 10 MG tablet Take 10 mg by mouth daily.  . memantine (NAMENDA) 10 MG tablet Take 1 tablet (10 mg total) by mouth 2 (two) times daily.  . methocarbamol (ROBAXIN) 500 MG tablet Take 1 tablet (500 mg total) by mouth 3 (three) times daily.  . naproxen (NAPROSYN) 500 MG tablet Take 1 tablet (500 mg total) by mouth 2 (two) times daily with a meal.  . atorvastatin (LIPITOR) 40 MG tablet Take 40 mg by mouth.   No facility-administered encounter medications on file as of 07/13/2016.   :  Review of Systems:  Out of a complete 14 point review of systems, all are reviewed and negative with the exception of these symptoms as listed below:  Review of Systems  Neurological:       No new concerns per patient.  Sister reports that patient was visited yesterday by nurse from [place].     Objective:  Neurologic Exam  Physical Exam Physical Examination:   Vitals:   07/13/16 1413  BP: 132/62  Pulse: 72  Resp: 14    General Examination: The patient is a very pleasant 60 y.o. female in no acute distress. She is calm and cooperative with the exam. She is in good spirits today.    HEENT: Normocephalic, atraumatic, pupils are equal, round and reactive to light and accommodation. Funduscopic exam is normal with sharp disc margins noted. Extraocular tracking shows saccadic breakdown without nystagmus noted. Hearing is intact. Face is symmetric with no facial masking and normal facial sensation, but she seems to have R facial tic, but she nor her sister were aware of it. There is no lip, neck or jaw tremor. Neck is not rigid with intact passive ROM. There are no carotid bruits on  auscultation. Oropharynx exam reveals mild mouth dryness. No significant airway crowding is noted. Mallampati is class II. Tongue protrudes centrally and palate elevates symmetrically.    Chest: is clear to auscultation without wheezing, rhonchi or crackles noted.  Heart: sounds are regular and normal without murmurs, rubs or gallops noted.   Abdomen: is soft, non-tender and non-distended with normal bowel sounds appreciated on auscultation.  Extremities: There  is no pitting edema in the distal lower extremities bilaterally. Pedal pulses are intact.   Skin: is warm and dry with no trophic changes noted. Age-related changes are noted on the skin.   Musculoskeletal: exam reveals no obvious joint deformities, tenderness or joint swelling or erythema.   Neurologically:  Mental status: The patient is awake and alert, paying good  attention. She is able to completely provide the history. Her sister provides details. She is oriented to: person, place, time/date, situation, day of week, month of year and year. Her memory, attention, language and knowledge are impaired. There is no aphasia, agnosia, apraxia or anomia. There is a mild degree of bradyphrenia. Speech is mildly hypophonic with no dysarthria noted. Mood is depressed and affect is constricted.   On 08/05/14: Her MMSE (Mini-Mental state exam) score is 26/30. CDT (Clock Drawing Test) score is 4/4. AFT (Animal Fluency Test) score is 11. Her geriatric depression score is 8.  On 02/03/2015: MMSE: 25/30, CDT: 4/4, AFT: 21/min, GDS: 7/15.  On 07/13/2016: MMSE: 26/30, CDT: 4/4, AFT: 28/min.   Cranial nerves are as described above under HEENT exam. In addition, shoulder shrug is normal with equal shoulder height noted.  Motor exam: Normal bulk, and strength for age is noted. Tone is not rigid with absence of cogwheeling in the extremities. There is overall no bradykinesia. There is no drift or rebound. There is no tremor. Romberg is negative. Reflexes  are 2+ in the upper extremities and 2+ in the lower extremities. Toes are downgoing bilaterally. Fine motor skills: Finger taps, hand movements, and rapid alternating patting are Not impaired bilaterally. Foot taps and foot agility are Not impaired bilaterally.   Cerebellar testing shows no dysmetria or intention tremor on finger to nose testing. Heel to shin is unremarkable. There is no truncal or gait ataxia.   Sensory exam is intact to light touch in the upper and lower extremities.   Gait, station and balance: She stands up from the seated position with no significant difficulty. No veering to one side is noted. No leaning to one side. Posture is Age-appropriate. Stance is narrow-based. She turns in 2 steps. Tandem walk is possible After initial hesitation . Balance is not particularly impaired.   Assessment and Plan:   In summary, Nicole Ward is a very pleasant 60 year old female with an underlying medical history of hypothyroidism, hypertension, recent diagnosis of type 2 diabetes, hyperlipidemia, and depression, who presents for follow up consultation of her memory loss of over 2 years' duration. She has stable findings, Memory scores in fact a little better today. She has been on dual medication. She tolerates her generic Aricept and generic Namenda. 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 She is stable from my end of things. She had vascular changes on her brain MRI in the past. I ordered another scan, but the insurance denied it at the time. Her physical exam is nonfocal. We talked about her repeat brain MRI results as well, thankfully with stable findings from 2013. I suggested repeat neuropsychological testing and she would be agreeable. I made a referral. She still has some stressors and issues with depression. She may benefit from seeing a counselor. I  renewed her prescriptions for donepezil and memantine. We talked about maintaining a healthy lifestyle in general and staying active mentally and physically. I encouraged the patient to eat healthy, exercise daily and keep well hydrated, to  keep a scheduled bedtime and wake time routine, to not skip any meals and eat healthy snacks in between meals and to have protein with every meal. I stressed the importance of regular exercise, within of course the patient's own mobility limitations. I encouraged the patient to keep up with current events by reading the news paper or watching the news and to do word puzzles, or if feasible, to go on [email or web address].   I suggested a 6 month follow-up, she can see one of our nurse practitioners at the time and I will see her back after that. I answered all their questions today and the patient and her sister were in agreement. I spent 25 minutes in total face-to-face time with the patient, more than 50% of which was spent in counseling and coordination of care, reviewing test results, reviewing medication and discussing or reviewing the diagnosis of memory loss, the prognosis and treatment options.

## 2016-07-13 NOTE — Patient Instructions (Signed)
We will continue with your medications. We will do a formal neuropsychological test (aka cognitive testing) for your memory complaints. This requires a referral to a trained and licensed neuropsychologist and will be a separate appointment at a different clinic.  Follow up in 6 months.

## 2016-08-27 ENCOUNTER — Other Ambulatory Visit: Payer: Self-pay | Admitting: Nurse Practitioner

## 2016-08-27 DIAGNOSIS — R9082 White matter disease, unspecified: Secondary | ICD-10-CM

## 2016-08-27 DIAGNOSIS — R413 Other amnesia: Secondary | ICD-10-CM

## 2016-10-25 ENCOUNTER — Other Ambulatory Visit (HOSPITAL_COMMUNITY): Payer: Self-pay | Admitting: Adult Health Nurse Practitioner

## 2016-10-25 DIAGNOSIS — Z1231 Encounter for screening mammogram for malignant neoplasm of breast: Secondary | ICD-10-CM

## 2016-10-25 DIAGNOSIS — Z780 Asymptomatic menopausal state: Secondary | ICD-10-CM

## 2016-10-26 ENCOUNTER — Encounter (INDEPENDENT_AMBULATORY_CARE_PROVIDER_SITE_OTHER): Payer: Self-pay | Admitting: *Deleted

## 2016-11-15 ENCOUNTER — Ambulatory Visit (HOSPITAL_COMMUNITY)
Admission: RE | Admit: 2016-11-15 | Discharge: 2016-11-15 | Disposition: A | Discharge status: Home / Self Care | Payer: Medicare HMO | Source: Ambulatory Visit | Attending: Adult Health Nurse Practitioner | Admitting: Adult Health Nurse Practitioner

## 2016-11-15 DIAGNOSIS — Z780 Asymptomatic menopausal state: Secondary | ICD-10-CM | POA: Diagnosis present

## 2016-11-15 DIAGNOSIS — Z1231 Encounter for screening mammogram for malignant neoplasm of breast: Secondary | ICD-10-CM | POA: Diagnosis not present

## 2017-01-11 ENCOUNTER — Ambulatory Visit: Payer: Medicare HMO | Admitting: Adult Health

## 2017-02-03 ENCOUNTER — Other Ambulatory Visit (HOSPITAL_COMMUNITY): Payer: Self-pay | Admitting: Adult Health Nurse Practitioner

## 2017-02-03 DIAGNOSIS — R0989 Other specified symptoms and signs involving the circulatory and respiratory systems: Secondary | ICD-10-CM

## 2017-02-08 ENCOUNTER — Other Ambulatory Visit (HOSPITAL_COMMUNITY): Payer: Self-pay

## 2017-02-09 ENCOUNTER — Ambulatory Visit (HOSPITAL_COMMUNITY)
Admission: RE | Admit: 2017-02-09 | Discharge: 2017-02-09 | Disposition: A | Discharge status: Home / Self Care | Payer: Medicare HMO | Source: Ambulatory Visit | Attending: Adult Health Nurse Practitioner | Admitting: Adult Health Nurse Practitioner

## 2017-02-09 DIAGNOSIS — I10 Essential (primary) hypertension: Secondary | ICD-10-CM | POA: Diagnosis not present

## 2017-02-09 DIAGNOSIS — I358 Other nonrheumatic aortic valve disorders: Secondary | ICD-10-CM | POA: Diagnosis not present

## 2017-02-09 DIAGNOSIS — R0989 Other specified symptoms and signs involving the circulatory and respiratory systems: Secondary | ICD-10-CM | POA: Insufficient documentation

## 2017-02-09 DIAGNOSIS — I340 Nonrheumatic mitral (valve) insufficiency: Secondary | ICD-10-CM | POA: Diagnosis not present

## 2017-02-09 DIAGNOSIS — E785 Hyperlipidemia, unspecified: Secondary | ICD-10-CM | POA: Insufficient documentation

## 2017-02-09 DIAGNOSIS — E119 Type 2 diabetes mellitus without complications: Secondary | ICD-10-CM | POA: Insufficient documentation

## 2017-02-09 LAB — ECHOCARDIOGRAM COMPLETE
AO mean calculated velocity dopler: 86.1 cm/s
AV Area VTI index: 0.79 cm2/m2
AV Area mean vel: 1.52 cm2
AV area mean vel ind: 0.88 cm2/m2
AVA: 1.37 cm2
AVAREAVTI: 1.45 cm2
AVCELMEANRAT: 0.76
AVG: 4 mmHg
AVLVOTPG: 4 mmHg
AVPG: 9 mmHg
AVPKVEL: 147 cm/s
Ao pk vel: 0.72 m/s
CHL CUP AV PEAK INDEX: 0.83
CHL CUP AV VEL: 1.37
CHL CUP MV DEC (S): 225
E decel time: 225 msec
EERAT: 9.67
FS: 40 % (ref 28–44)
IV/PV OW: 1.09
LA ID, A-P, ES: 33 mm
LA diam end sys: 33 mm
LA diam index: 1.9 cm/m2
LA vol index: 21.1 mL/m2
LAVOL: 36.8 mL
LAVOLA4C: 30.5 mL
LDCA: 2.01 cm2
LV PW d: 8.5 mm — AB (ref 0.6–1.1)
LV SIMPSON'S DISK: 61
LV TDI E'LATERAL: 10
LV TDI E'MEDIAL: 7.51
LV dias vol index: 28 mL/m2
LV dias vol: 49 mL (ref 46–106)
LV e' LATERAL: 10 cm/s
LV sys vol index: 11 mL/m2
LVEEAVG: 9.67
LVEEMED: 9.67
LVOT VTI: 23.1 cm
LVOT peak VTI: 0.68 cm
LVOTD: 16 mm
LVOTPV: 106 cm/s
LVOTSV: 46 mL
LVSYSVOL: 19 mL
MV pk A vel: 81.7 m/s
MV pk E vel: 96.7 m/s
MVPG: 4 mmHg
RV LATERAL S' VELOCITY: 11.9 cm/s
RV sys press: 25 mmHg
Reg peak vel: 233 cm/s
Stroke v: 30 ml
TAPSE: 24 mm
TRMAXVEL: 233 cm/s
VTI: 33.9 cm
Valve area index: 0.79

## 2017-02-09 NOTE — Progress Notes (Signed)
*  PRELIMINARY RESULTS* Echocardiogram 2D Echocardiogram has been performed.  Stacey DrainWhite, Dontel Harshberger J 02/09/2017, 10:04 AM

## 2017-02-10 ENCOUNTER — Ambulatory Visit (HOSPITAL_COMMUNITY)
Admission: RE | Admit: 2017-02-10 | Discharge: 2017-02-10 | Disposition: A | Discharge status: Home / Self Care | Payer: Medicare HMO | Source: Ambulatory Visit | Attending: Adult Health Nurse Practitioner | Admitting: Adult Health Nurse Practitioner

## 2017-02-10 DIAGNOSIS — R0989 Other specified symptoms and signs involving the circulatory and respiratory systems: Secondary | ICD-10-CM | POA: Diagnosis not present

## 2017-02-10 DIAGNOSIS — I6523 Occlusion and stenosis of bilateral carotid arteries: Secondary | ICD-10-CM | POA: Diagnosis not present

## 2017-03-01 ENCOUNTER — Other Ambulatory Visit: Payer: Self-pay | Admitting: Neurology

## 2017-03-01 DIAGNOSIS — R9082 White matter disease, unspecified: Secondary | ICD-10-CM

## 2017-03-01 DIAGNOSIS — R413 Other amnesia: Secondary | ICD-10-CM

## 2017-03-07 ENCOUNTER — Encounter: Payer: Self-pay | Admitting: Nurse Practitioner

## 2017-03-07 ENCOUNTER — Ambulatory Visit (INDEPENDENT_AMBULATORY_CARE_PROVIDER_SITE_OTHER): Payer: Medicare HMO | Admitting: Nurse Practitioner

## 2017-03-07 VITALS — BP 136/84 | HR 75 | Wt 156.0 lb

## 2017-03-07 DIAGNOSIS — R930 Abnormal findings on diagnostic imaging of skull and head, not elsewhere classified: Secondary | ICD-10-CM | POA: Diagnosis not present

## 2017-03-07 DIAGNOSIS — R413 Other amnesia: Secondary | ICD-10-CM | POA: Diagnosis not present

## 2017-03-07 DIAGNOSIS — R9082 White matter disease, unspecified: Secondary | ICD-10-CM

## 2017-03-07 MED ORDER — DONEPEZIL HCL 10 MG PO TABS: 10.0000 mg | ORAL_TABLET | Freq: Every day | ORAL | 6 refills | Status: DC

## 2017-03-07 NOTE — Progress Notes (Signed)
[facility]  PATIENT: Nicole Ward DOB: [birth year]   REASON FOR VISIT: Follow-up for memory loss HISTORY FROM: Patient, Sister. Nicole Ward, and daughter Nicole Ward    HISTORY OF PRESENT ILLNESS:UPDATE 04/02/2018CM Nicole Ward, 60 year old female returns for follow-up with history of memory loss for several years. She currently lives with her daughter who works full time. She is fairly isolated at home, she does not get out and walk. Appetite is good and she is sleeping well. Most recent MRI of the brain done in July 2016 shows mild atrophy without acute findings and no change from June 2013. No safety issues identified according to the daughter. She is currently on Aricept and Namenda without side effects. She has had neuropsych testing with Dr. Jacquelyne Ward on 02/15/2017. Test results revealed intact functioning across all cognitive domains and thinking skills with the exception of impaired learning and memory. There is also significant degree of depression secondary to multiple personal traumas. 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 There is suggestion is to continue cholinesterase inhibitor for memory difficulties but consider discontinuing Namenda as the patient is clearly not within the moderate to severe stage of dementia psychotherapy is encouraged to better manage her mood symptomology. Also consider a psychiatric consult. Neuropsych testing reevaluation recommended for one year. She returns for reevaluation 07/13/16 SAMs. Nicole Ward is a 61 year old right-handed woman with an underlying medical history of hypothyroidism, hypertension, recent diagnosis of type 2 diabetes, hyperlipidemia, and depression, who presents for follow-up consultation of her memory loss. She is accompanied by her sister Nicole Ward  today. I last saw her on 02/03/2015, at which time she reported an increase in her lovastatin to 40 mg. Her sister felt that her  short-term memory was worse. She had a new PCP. I suggested we proceed with a carotid Doppler ultrasound and brain MRI. She had a ultrasound carotid bilaterally on 02/13/2015 which showed no significant stenosis involving the extracranial carotid arteries bilaterally.  REVIEW OF SYSTEMS: Full 14 system review of systems performed and notable only for those listed, all others are neg:  Constitutional: neg  Cardiovascular: neg Ear/Nose/Throat: neg  Skin: neg Eyes: Blurred vision Respiratory: neg Gastroitestinal: neg  Hematology/Lymphatic: neg  Endocrine: Intolerance to cold Musculoskeletal:neg Allergy/Immunology: neg Neurological: Memory loss Psychiatric: neg Sleep : neg   ALLERGIES: Allergies  Allergen Reactions  . Trazodone And Nefazodone Palpitations    HOME MEDICATIONS: Outpatient Medications Prior to Visit  Medication Sig Dispense Refill  . Cholecalciferol (VITAMIN D PO) Take 1 tablet by mouth daily.    Marland Kitchen donepezil (ARICEPT) 10 MG tablet Take 1 tablet (10 mg total) by mouth at bedtime. 30 tablet 5  . levothyroxine (SYNTHROID, LEVOTHROID) 25 MCG tablet Take 25 mcg by mouth daily before breakfast.    . lisinopril (PRINIVIL,ZESTRIL) 10 MG tablet Take 10 mg by mouth daily.    . memantine (NAMENDA) 10 MG tablet TAKE (1) TABLET TWICE DAILY. 60 tablet 5  . methocarbamol (ROBAXIN) 500 MG tablet Take 1 tablet (500 mg total) by mouth 3 (three) times daily. 21 tablet 0  . naproxen (NAPROSYN) 500 MG tablet Take 1 tablet (500 mg total) by mouth 2 (two) times daily with a meal. 20 tablet 0  . glipiZIDE (GLUCOTROL XL) 5 MG 24 hr tablet Take 5 mg by mouth daily with breakfast.     . atorvastatin (LIPITOR) 40 MG tablet Take 40 mg by mouth.     No facility-administered medications prior to visit.  PAST MEDICAL HISTORY: Past Medical History:  Diagnosis Date  . Alzheimer's disease with early onset   . Hyperlipidemia    off meds for years  . Hypertension   . Memory loss   . Thyroid  disease 05/2014   Patient unsure if hyper or hypo    PAST SURGICAL HISTORY: Past Surgical History:  Procedure Laterality Date  . BREAST BIOPSY     benign  . COLONOSCOPY  approx 2005   patient not sure of findings or where procedure done  . PARTIAL HYSTERECTOMY      FAMILY HISTORY: Family History  Problem Relation Age of Onset  . Colon cancer Father     late 75s  . Liver disease Father     cirrhosis, etoh    SOCIAL HISTORY: Social History   Social History  . Marital status: Divorced    Spouse name: N/A  . Number of children: 1  . Years of education: 25   Occupational History  .  [employer]   Social History Main Topics  . Smoking status: Never Smoker  . Smokeless tobacco: Never Used  . Alcohol use No  . Drug use: No  . Sexual activity: Not on file   Other Topics Concern  . Not on file   Social History Narrative   Patient states she remarried her husband in 50. States he is in jail for cocaine use. Under a lot of stress.     PHYSICAL EXAM  Vitals:   03/07/17 1420  BP: 136/84  Pulse: 75  Weight: 156 lb (70.8 kg)   Body mass index is 25.18 kg/m.  Generalized: Well developed, in no acute distress  Head: normocephalic and atraumatic,. Oropharynx benign  Neck: Supple, no carotid bruits  Cardiac: Regular rate rhythm, no murmur  Musculoskeletal: No deformity   Neurological examination   Mentation: Alert .AFT 15. Clock drawing 4/4Follows all commands speech and language fluent.    MMSE - Mini Mental State Exam 03/07/2017 07/13/2016 12/30/2015  Orientation to time Orientation to Place Registration Attention/ Calculation Recall Language- name 2 objects Language- repeat 0 1 0  Language- follow 3 step command Language- read & follow direction Write a sentence Copy design Total score Cranial nerve II-XII: Pupils were equal round reactive to light extraocular movements were  full, visual field were full on confrontational test. Facial sensation and strength were normal. hearing was intact to finger rubbing bilaterally. Uvula tongue midline. head turning and shoulder shrug were normal and symmetric.Tongue protrusion into cheek strength was normal. Motor: normal bulk and tone, full strength in the BUE, BLE, Sensory: normal and symmetric to light touch, pinprick, and  Vibration,  in the upper and lower extremities Coordination : finger-nose-finger, heel-to-shin bilaterally, no dysmetria, no tremor Reflexes: Brachioradialis 2/2, biceps 2/2, triceps 2/2, patellar 2/2, Achilles 2/2, plantar responses were flexor bilaterally. Gait and Station: Rising up from seated position without assistance, normal stance,  moderate stride, good arm swing, smooth turning, able to perform tiptoe, and heel walking without difficulty. Tandem gait is steady. No assistive device   DIAGNOSTIC DATA (LABS, IMAGING, TESTING) - I reviewed patient records, labs, notes, testing and imaging myself where available.     ASSESSMENT AND PLAN  61 y.o. year old female  has a past medical history of memory losswith early onset; Hyperlipidemia; Hypertension; Memory loss; and Thyroid disease (05/2014). here to follow-up. Recent neuropsych testing  results revealed intact functioning across all cognitive domains and thinking skills with the exception of impaired learning and memory. There is also significant degree degree of depression secondary to multiple personal traumas. 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 There is suggestion is to continue cholinesterase inhibitor for memory difficulties but consider discontinuing Namenda as the patient is clearly not within the moderate to severe stage of dementia psychotherapy is encouraged to better manage her mood symptomology.Will discuss with Dr. Frances Furbish on her return. The patient is a current patient of Dr. Frances Furbish  who is  out of the office today . This note is sent to the work in doctor.        PLAN: Continue Aricept and Namenda at current doses Will discuss neuropsych testing with Dr. Frances Furbish on return next week MMSE 25/30. Last 26/30. This is stable Regular  Exercise good nutrition Performance of activities will be best in a structured routine and familiar environment Multitasking should be avoided Use a daily planner.   If Having trouble administering medications using a medication box   is  recommended to ensure compliance   Follow up in 6 months with Dr. Frances Furbish I spent 25 minutes in total face to face time with the patient and daughter and sister more than 50% of which was spent counseling and coordination of care, reviewing recent neuropsych testing and recommendations , reviewing medications and discussing and reviewing the diagnosis of memory loss.    Nilda Riggs, [facility], [facility], APRN  [facility] [address] [place], [place] [address] [phone]

## 2017-03-07 NOTE — Progress Notes (Signed)
I have read the note, and I agree with the clinical assessment and plan.  Yarethzy Croak KEITH   

## 2017-03-07 NOTE — Patient Instructions (Signed)
Continue Aricept and Namenda at current doses Will discuss neuropsych testing with Dr. Frances Furbish on return next week MMSE 25/30. Last 26/30. This is stable Regular  Exercise good nutrition  Follow up in 6 months with Dr. Frances Furbish

## 2017-09-01 ENCOUNTER — Other Ambulatory Visit: Payer: Self-pay | Admitting: Neurology

## 2017-09-01 DIAGNOSIS — R413 Other amnesia: Secondary | ICD-10-CM

## 2017-09-01 DIAGNOSIS — R9082 White matter disease, unspecified: Secondary | ICD-10-CM

## 2017-09-06 ENCOUNTER — Telehealth: Payer: Self-pay | Admitting: Neurology

## 2017-09-06 ENCOUNTER — Encounter (INDEPENDENT_AMBULATORY_CARE_PROVIDER_SITE_OTHER): Payer: Self-pay

## 2017-09-06 ENCOUNTER — Ambulatory Visit (INDEPENDENT_AMBULATORY_CARE_PROVIDER_SITE_OTHER): Payer: Self-pay | Admitting: Neurology

## 2017-09-06 DIAGNOSIS — R413 Other amnesia: Secondary | ICD-10-CM

## 2017-09-06 MED ORDER — TOPIRAMATE ER 50 MG PO SPRINKLE CAP24: 50.0000 mg | EXTENDED_RELEASE_CAPSULE | Freq: Every day | ORAL | 6 refills | Status: DC

## 2017-09-06 MED ORDER — AMITRIPTYLINE HCL 25 MG PO TABS: 25.0000 mg | ORAL_TABLET | Freq: Every day | ORAL | 3 refills | Status: DC

## 2017-09-06 NOTE — Patient Instructions (Signed)
Amitriptyline tablets What is this medicine? AMITRIPTYLINE (a mee TRIP ti leen) is used to treat depression. This medicine may be used for other purposes; ask your health care provider or pharmacist if you have questions. COMMON BRAND NAME(S): Elavil, Vanatrip What should I tell my health care provider before I take this medicine? They need to know if you have any of these conditions: -an alcohol problem -asthma, difficulty breathing -bipolar disorder or schizophrenia -difficulty passing urine, prostate trouble -glaucoma -heart disease or previous heart attack -liver disease -over active thyroid -seizures -thoughts or plans of suicide, a previous suicide attempt, or family history of suicide attempt -an unusual or allergic reaction to amitriptyline, other medicines, foods, dyes, or preservatives -pregnant or trying to get pregnant -breast-feeding How should I use this medicine? Take this medicine by mouth with a drink of water. Follow the directions on the prescription label. You can take the tablets with or without food. Take your medicine at regular intervals. Do not take it more often than directed. Do not stop taking this medicine suddenly except upon the advice of your doctor. Stopping this medicine too quickly may cause serious side effects or your condition may worsen. A special MedGuide will be given to you by the pharmacist with each prescription and refill. Be sure to read this information carefully each time. Talk to your pediatrician regarding the use of this medicine in children. Special care may be needed. Overdosage: If you think you have taken too much of this medicine contact a poison control center or emergency room at once. NOTE: This medicine is only for you. Do not share this medicine with others. What if I miss a dose? If you miss a dose, take it as soon as you can. If it is almost time for your next dose, take only that dose. Do not take double or extra  doses. What may interact with this medicine? Do not take this medicine with any of the following medications: -arsenic trioxide -certain medicines used to regulate abnormal heartbeat or to treat other heart conditions -cisapride -droperidol -halofantrine -linezolid -MAOIs like Carbex, Eldepryl, Marplan, Nardil, and Parnate -methylene blue -other medicines for mental depression -phenothiazines like perphenazine, thioridazine and chlorpromazine -pimozide -probucol -procarbazine -sparfloxacin -St. John's Wort -ziprasidone This medicine may also interact with the following medications: -atropine and related drugs like hyoscyamine, scopolamine, tolterodine and others -barbiturate medicines for inducing sleep or treating seizures, like phenobarbital -cimetidine -disulfiram -ethchlorvynol -thyroid hormones such as levothyroxine This list may not describe all possible interactions. Give your health care provider a list of all the medicines, herbs, non-prescription drugs, or dietary supplements you use. Also tell them if you smoke, drink alcohol, or use illegal drugs. Some items may interact with your medicine. What should I watch for while using this medicine? Tell your doctor if your symptoms do not get better or if they get worse. Visit your doctor or health care professional for regular checks on your progress. Because it may take several weeks to see the full effects of this medicine, it is important to continue your treatment as prescribed by your doctor. Patients and their families should watch out for new or worsening thoughts of suicide or depression. Also watch out for sudden changes in feelings such as feeling anxious, agitated, panicky, irritable, hostile, aggressive, impulsive, severely restless, overly excited and hyperactive, or not being able to sleep. If this happens, especially at the beginning of treatment or after a change in dose, call your health care professional. You may    get drowsy or dizzy. Do not drive, use machinery, or do anything that needs mental alertness until you know how this medicine affects you. Do not stand or sit up quickly, especially if you are an older patient. This reduces the risk of dizzy or fainting spells. Alcohol may interfere with the effect of this medicine. Avoid alcoholic drinks. Do not treat yourself for coughs, colds, or allergies without asking your doctor or health care professional for advice. Some ingredients can increase possible side effects. Your mouth may get dry. Chewing sugarless gum or sucking hard candy, and drinking plenty of water will help. Contact your doctor if the problem does not go away or is severe. This medicine may cause dry eyes and blurred vision. If you wear contact lenses you may feel some discomfort. Lubricating drops may help. See your eye doctor if the problem does not go away or is severe. This medicine can cause constipation. Try to have a bowel movement at least every 2 to 3 days. If you do not have a bowel movement for 3 days, call your doctor or health care professional. This medicine can make you more sensitive to the sun. Keep out of the sun. If you cannot avoid being in the sun, wear protective clothing and use sunscreen. Do not use sun lamps or tanning beds/booths. What side effects may I notice from receiving this medicine? Side effects that you should report to your doctor or health care professional as soon as possible: -allergic reactions like skin rash, itching or hives, swelling of the face, lips, or tongue -anxious -breathing problems -changes in vision -confusion -elevated mood, decreased need for sleep, racing thoughts, impulsive behavior -eye pain -fast, irregular heartbeat -feeling faint or lightheaded, falls -feeling agitated, angry, or irritable -fever with increased sweating -hallucination, loss of contact with reality -seizures -stiff muscles -suicidal thoughts or other mood  changes -tingling, pain, or numbness in the feet or hands -trouble passing urine or change in the amount of urine -trouble sleeping -unusually weak or tired -vomiting -yellowing of the eyes or skin Side effects that usually do not require medical attention (report to your doctor or health care professional if they continue or are bothersome): -change in sex drive or performance -change in appetite or weight -constipation -dizziness -dry mouth -nausea -tired -tremors -upset stomach This list may not describe all possible side effects. Call your doctor for medical advice about side effects. You may report side effects to FDA at 1-800-FDA-1088. Where should I keep my medicine? Keep out of the reach of children. Store at room temperature between 20 and 25 degrees C (68 and 77 degrees F). Throw away any unused medicine after the expiration date. NOTE: This sheet is a summary. It may not cover all possible information. If you have questions about this medicine, talk to your doctor, pharmacist, or health care provider.  2018 Elsevier/Gold Standard (2016-04-23 12:14:15)   Topiramate extended-release capsules What is this medicine? TOPIRAMATE (toe PYRE a mate) is used to treat seizures in adults or children with epilepsy. It is also used for the prevention of migraine headaches. This medicine may be used for other purposes; ask your health care provider or pharmacist if you have questions. COMMON BRAND NAME(S): Trokendi XR What should I tell my health care provider before I take this medicine? They need to know if you have any of these conditions: -cirrhosis of the liver or liver disease -diarrhea -glaucoma -kidney stones or kidney disease -lung disease like asthma, obstructive pulmonary disease, emphysema -  metabolic acidosis -on a ketogenic diet -scheduled for surgery or a procedure -suicidal thoughts, plans, or attempt; a previous suicide attempt by you or a family member -an  unusual or allergic reaction to topiramate, other medicines, foods, dyes, or preservatives -pregnant or trying to get pregnant -breast-feeding How should I use this medicine? Take this medicine by mouth with a glass of water. Follow the directions on the prescription label. Trokendi XR capsules must be swallowed whole. Do not sprinkle on food, break, crush, dissolve, or chew. Qudexy XR capsules may be swallowed whole or opened and sprinkled on a small amount of soft food. This mixture must be swallowed immediately. Do not chew or store mixture for later use. You may take this medicine with meals. Take your medicine at regular intervals. Do not take it more often than directed. Talk to your pediatrician regarding the use of this medicine in children. Special care may be needed. While Trokendi XR may be prescribed for children as young as 6 years and Qudexy XR may be prescribed for children as young as 2 years for selected conditions, precautions do apply. Overdosage: If you think you have taken too much of this medicine contact a poison control center or emergency room at once. NOTE: This medicine is only for you. Do not share this medicine with others. What if I miss a dose? If you miss a dose, take it as soon as you can. If it is almost time for your next dose, take only that dose. Do not take double or extra doses. What may interact with this medicine? Do not take this medicine with any of the following medications: -probenecid This medicine may also interact with the following medications: -acetazolamide -alcohol -amitriptyline -birth control pills -digoxin -hydrochlorothiazide -lithium -medicines for pain, sleep, or muscle relaxation -metformin -methazolamide -other seizure or epilepsy medicines -pioglitazone -risperidone This list may not describe all possible interactions. Give your health care provider a list of all the medicines, herbs, non-prescription drugs, or dietary  supplements you use. Also tell them if you smoke, drink alcohol, or use illegal drugs. Some items may interact with your medicine. What should I watch for while using this medicine? Visit your doctor or health care professional for regular checks on your progress. Do not stop taking this medicine suddenly. This increases the risk of seizures if you are using this medicine to control epilepsy. Wear a medical identification bracelet or chain to say you have epilepsy or seizures, and carry a card that lists all your medicines. This medicine can decrease sweating and increase your body temperature. Watch for signs of deceased sweating or fever, especially in children. Avoid extreme heat, hot baths, and saunas. Be careful about exercising, especially in hot weather. Contact your health care provider right away if you notice a fever or decrease in sweating. You should drink plenty of fluids while taking this medicine. If you have had kidney stones in the past, this will help to reduce your chances of forming kidney stones. If you have stomach pain, with nausea or vomiting and yellowing of your eyes or skin, call your doctor immediately. You may get drowsy, dizzy, or have blurred vision. Do not drive, use machinery, or do anything that needs mental alertness until you know how this medicine affects you. To reduce dizziness, do not sit or stand up quickly, especially if you are an older patient. Alcohol can increase drowsiness and dizziness. Avoid alcoholic drinks. Do not drink alcohol for 6 hours before or 6 hours after   taking Trokendi XR. If you notice blurred vision, eye pain, or other eye problems, seek medical attention at once for an eye exam. The use of this medicine may increase the chance of suicidal thoughts or actions. Pay special attention to how you are responding while on this medicine. Any worsening of mood, or thoughts of suicide or dying should be reported to your health care professional right  away. This medicine may increase the chance of developing metabolic acidosis. If left untreated, this can cause kidney stones, bone disease, or slowed growth in children. Symptoms include breathing fast, fatigue, loss of appetite, irregular heartbeat, or loss of consciousness. Call your doctor immediately if you experience any of these side effects. Also, tell your doctor about any surgery you plan on having while taking this medicine since this may increase your risk for metabolic acidosis. Birth control pills may not work properly while you are taking this medicine. Talk to your doctor about using an extra method of birth control. Women who become pregnant while using this medicine may enroll in the [place] Antiepileptic Drug Pregnancy Registry by calling 1-888-233-2334. This registry collects information about the safety of antiepileptic drug use during pregnancy. What side effects may I notice from receiving this medicine? Side effects that you should report to your doctor or health care professional as soon as possible: -allergic reactions like skin rash, itching or hives, swelling of the face, lips, or tongue -decreased sweating and/or rise in body temperature -depression -difficulty breathing, fast or irregular breathing patterns -difficulty speaking -difficulty walking or controlling muscle movements -hearing impairment -redness, blistering, peeling or loosening of the skin, including inside the mouth -tingling, pain or numbness in the hands or feet -unusually weak or tired -worsening of mood, thoughts or actions of suicide or dying Side effects that usually do not require medical attention (report to your doctor or health care professional if they continue or are bothersome): -altered taste -back pain, joint or muscle aches and pains -diarrhea, or constipation -headache -loss of appetite -nausea -stomach upset, indigestion -tremors This list may not describe all possible  side effects. Call your doctor for medical advice about side effects. You may report side effects to FDA at 1-800-FDA-1088. Where should I keep my medicine? Keep out of the reach of children. Store at room temperature between 15 and 30 degrees C (59 and 86 degrees F) in a tightly closed container. Protect from moisture. Throw away any unused medicine after the expiration date. NOTE: This sheet is a summary. It may not cover all possible information. If you have questions about this medicine, talk to your doctor, pharmacist, or health care provider.  2018 Elsevier/Gold Standard (2016-03-12 12:33:11)  

## 2017-09-06 NOTE — Progress Notes (Signed)
Appointment in error 

## 2017-09-07 NOTE — Telephone Encounter (Signed)
It appears that pt has been scheduled for 09/08/2017.

## 2017-09-07 NOTE — Telephone Encounter (Signed)
Called the patient to let her know that we had an opening for tomorrow at 1:30. They accepted that slot and her sister will bring her.

## 2017-09-08 ENCOUNTER — Ambulatory Visit (INDEPENDENT_AMBULATORY_CARE_PROVIDER_SITE_OTHER): Payer: Medicare HMO | Admitting: Neurology

## 2017-09-08 ENCOUNTER — Encounter: Payer: Self-pay | Admitting: Neurology

## 2017-09-08 VITALS — BP 126/60 | HR 82 | Ht 66.0 in | Wt 161.0 lb

## 2017-09-08 DIAGNOSIS — G309 Alzheimer's disease, unspecified: Secondary | ICD-10-CM | POA: Diagnosis not present

## 2017-09-08 DIAGNOSIS — R9082 White matter disease, unspecified: Secondary | ICD-10-CM | POA: Diagnosis not present

## 2017-09-08 DIAGNOSIS — R413 Other amnesia: Secondary | ICD-10-CM

## 2017-09-08 DIAGNOSIS — F0280 Dementia in other diseases classified elsewhere without behavioral disturbance: Secondary | ICD-10-CM

## 2017-09-08 MED ORDER — MEMANTINE HCL 10 MG PO TABS: ORAL_TABLET | ORAL | 5 refills | Status: DC

## 2017-09-08 MED ORDER — DONEPEZIL HCL 10 MG PO TABS: 10.0000 mg | ORAL_TABLET | Freq: Every day | ORAL | 6 refills | Status: DC

## 2017-09-08 NOTE — Patient Instructions (Signed)
We will continue with your memory medications.   I would like to have you come back in 6 months for a recheck with Darrol Angel, NP.

## 2017-09-08 NOTE — Progress Notes (Signed)
Subjective:    Patient ID: Nicole Ward is a 61 y.o. female.  HPI     Interim history:   Nicole Ward is a 61 year old right-handed woman with an underlying medical history of hypothyroidism, hypertension, recent diagnosis of type 2 diabetes, hyperlipidemia, and depression, who presents for follow-up consultation of her memory loss. She is accompanied by her sister again today. I last saw her on 07/13/2016, at which time she was on dual therapy, had some depressive symptoms off and on was tolerating her medications. PCP had started the memantine. MMSE was 26/30 at the time.   She saw Cecille Rubin in the interim on 03/07/2017 at which time her MMSE was 25/30. She was advised to continue her dual medication regimen.  Today, 09/08/2017: She reports doing okay. She does not exercise very much. She tries to hydrated. Lost her nephew recently. Tolerating meds. Need refills.     She had recent neuropsych testing with Dr. Vikki Ports on 02/15/17:  FINAL DIAGNOSES:  Mild Neurocognitive Disorder (i.e., mild cognitive impairment), amnestic (rule our prodromal Alzheimer's disease versus pseudodementia) Unspecified Depressive Disorder   RECOMMENDATIONS: 1. Follow-up with Dr. Rexene Alberts.  ? Could continue treatment with cholinesterase inhibitor for circumscribed memory difficulties, though I feel uncertain whether a degenerative condition is present given the 5-6 year history and the fact that memory is the only domain affected, particularly given that this would be an early onset presentation. Review of the previous neuropsychological evaluations would be valuable; they have been requested. If obtained and my opinion changes, I will amend and resend this report to her care providers.  ? Consider discontinuing memantine, as the patient is clearly not within the moderate to severe range of dementia. In fact, she does meet the clinical criteria for a dementia diagnosis.  ? Encourage follow-up care with  behavioral health, as this individual appears to be experiencing clinical symptoms of depression secondary to multiple past traumas. As such, engaging in psychotherapy is STRONGLY recommended to better manage mood symptomology.  ? Could consider a psychiatry consult ? It is recommended that the patient aggressively manage any modifiable risk factors for further cognitive decline such as strict compliance with prescribed medical treatments for any cerebrovascular risk factors.   2. Neuropsychological re-evaluation is recommended in approximately one year (or sooner) to monitor treatment efficacy, to assist with the management of the patient (start or continue rehab or pharmacological therapy), to determine any clinical and functional significance of brain abnormality over time, as well as to document any potential improvement or decline in cognitive functioning. Lastly, any follow-up testing will help delineate the specific cognitive basis of any new functional complaints. We will schedule this follow-up.   The following are several strategies that may help compensate for any real or perceived cognitive deficits. These are to be used as applicable.  ? Performance will generally be best in a structured, routine, and familiar environment, as opposed to situations involving complex problems.  ? To the extent possible, multitasking should be avoided. And if there are difficulties in organization and planning, maintaining a daily organizer to help keep track of important appointments and information may be beneficial.  ? Memory problems may at least be minimally addressed using compensatory strategies such as the use of a daily schedule to follow, memos, portable recorder, a centrally located bulletin board, or memory notebook. A large calendar, placed in a highly visible location would be valuable to keep track of dates and appointments. In addition, it would be helpful to keep  a log of all of medical  appointments with the name of the doctor, date of visit, diagnoses, and treatments.  ? Use of a medication box is recommended to ensure compliance.   The patient's allergies, current medications, family history, past medical history, past social history, past surgical history and problem list were reviewed and updated as appropriate.   Previously (copied from previous notes for reference):   I saw her on 02/03/2015, at which time she reported an increase in her lovastatin to 40 mg. Her sister felt that her short-term memory was worse. She had a new PCP. I suggested we proceed with a carotid Doppler ultrasound and brain MRI. She had a ultrasound carotid bilaterally on 02/13/2015 which showed no significant stenosis involving the extracranial carotid arteries bilaterally.   She had a brain MRI without contrast on 06/25/2015 which I reviewed: IMPRESSION:  Abnormal MRI brain (without) demonstrating: 1. Mild perisylvian atrophy. 2. Mild chronic small vessel ischemic disease. 3. No acute findings. 4. No significant change from MRI on 05/31/12.   She was only on monotherapy with donepezil at the time.   In the interim, she was seen by Cecille Rubin, nurse practitioner on 12/30/2015, at which time she was advised to continue with generic Namenda twice daily and generic Aricept once daily. Her MMSE was 27 out of 30 at the time.   I first met her on 08/05/2014 at the request of her primary care provider, at which time she reported a history of memory loss for the past 2 years. She was on Aricept 10 mg for about 2 years. Her MMSE was 26 out of 30 at the time, clock drawing was 4 out of 4, animal fluency was 11.  I ordered a repeat brain MRI and blood work. I did not suggest any new medications. Blood work from 08/05/2014 showed a normal RPR, normal B12, normal TSH, vitamin D was low at 10.4 and hemoglobin A1c was elevated at 6.4. We called her with her test results and advised her to talk to her primary  care physician regarding her low vitamin D but she was advised to start an over-the-counter vitamin D supplement at 2000 units daily.   She had repeat neuropsychological testing on 10/08/2014 and compared to her test results from 07/06/2013 her findings were stable. 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   She resides with her sister. She is reportedly still driving but does not feel comfortable going far by herself and has gotten lost. She had a previous neurological workup in the form of the EEG in the past which was negative according to your note. CT head in the past showed white matter changes. She has no personal history of epilepsy or stroke or migraine headaches. She's had some recent recurrent headaches with relief with over-the-counter Advil or Aleve. She has seen Dr. Sima Matas, psychologist in the past and I reviewed his records from June 2014. His impression was memory difficulties, executive function deficits angiographic disorientation. He felt that her memory difficulties were beyond those typically seen with depression and stress and he suggested neuro cognitive testing. The patient had her neuropsychological evaluation on 06/05/2013. I reviewed the test results. The impression was that findings were consistent with those found with early-onset dementia of the Alzheimer's type. While the patient does have a generally complicated medical picture with abnormalities shown on the MRI brain that may be related to vascular changes, his impression was that  of early-onset Alzheimer's disease. He recommended re-testing in 6-9 months, but this was not done.  She had a brain MRI without contrast on 05/31/2012: No acute intracranial abnormality. Multiple small subcortical white matter hyperintensities are present. These may be due to chronic microvascular ischemia, migraine headaches, vasculitis also possible. In  addition, I personally reviewed the images through the PACS system. Prior to that she had a head CT without contrast on 09/15/2011: Showed no acute abnormality. In addition, I personally reviewed the images through the PACS system. Her vascular risk factors include hyperlipidemia, diabetes, hypertension, overweight state with a BMI of 28.3. She reports a lot of stress with respect to her husband's addiction and she got divorced, but re-married her ex-husband, and lost her job, income, her home and has been divorced for 2 years.   She used to see Dr. Merlene Laughter, but d/t insurance issues, she could not go back to him.   Her MGM had some memory loss and mother is 38 yo and has had some memory loss, but both had memory loss in their older age.   She has had some weight loss. She has anxiety and fear, but no paranoia. She worked third shift at [employer] for about 8 years and was laid off in 11/13. She now lives with her daughter who work 12 h shift and her 32 yo GD, who is in school and sometimes she feels fearful of staying at home. She is depressed, but is not on any medication. She was on Remeron, but gained.  Symptom onset was gradual and are now fairly stable. She primarily has been having difficulty with short-term memory such as forgetfulness, misplacing things, asking the same question again and forgetting dates and events. There is some report of confusion or disorientation. Familiar faces are easily recognized. She drives within 5 m radius, not at night and no interstate.   She reports some headaches.   She has a history of smoking marijuana. She is a non-smoker. She does not drink alcohol. she denies suicidal ideations or homicidal ideations.    The patient denies prior TIA or stroke symptoms, such as sudden onset of one sided weakness, numbness, tingling, slurring of speech or droopy face, hearing loss, tinnitus, diplopia or visual field cut or monocular loss of vision.  Of note, the patient denies  snoring, and there is no report of witnessed apneas.   Her Past Medical History Is Significant For: Past Medical History:  Diagnosis Date  . Alzheimer's disease with early onset   . Hyperlipidemia    off meds for years  . Hypertension   . Memory loss   . Thyroid disease 05/2014   Patient unsure if hyper or hypo    Her Past Surgical History Is Significant For: Past Surgical History:  Procedure Laterality Date  . BREAST BIOPSY     benign  . COLONOSCOPY  approx 2005   patient not sure of findings or where procedure done  . PARTIAL HYSTERECTOMY      Her Family History Is Significant For: Family History  Problem Relation Age of Onset  . Colon cancer Father        late 26s  . Liver disease Father        cirrhosis, etoh    Her Social History Is Significant For: Social History   Social History  . Marital status: Divorced    Spouse name: N/A  . Number of children: 1  . Years of education: 78   Occupational History  .  [employer]   Social History Main Topics  . Smoking status: Never Smoker  . Smokeless tobacco: Never Used  . Alcohol use No  . Drug use: No  . Sexual activity: Not Asked   Other Topics Concern  . None   Social History Narrative   Patient states she remarried her husband in 2. States he is in jail for cocaine use. Under a lot of stress.    Her Allergies Are:  Allergies  Allergen Reactions  . Trazodone And Nefazodone Palpitations  :   Her Current Medications Are:  Outpatient Encounter Prescriptions as of 09/08/2017  Medication Sig  . aspirin EC 81 MG tablet Take 81 mg by mouth daily.  . Cholecalciferol (VITAMIN D PO) Take 1 tablet by mouth daily.  Marland Kitchen donepezil (ARICEPT) 10 MG tablet Take 1 tablet (10 mg total) by mouth at bedtime.  Marland Kitchen levothyroxine (SYNTHROID, LEVOTHROID) 25 MCG tablet Take 25 mcg by mouth daily before breakfast.  . lisinopril (PRINIVIL,ZESTRIL) 10 MG tablet Take 10 mg by mouth daily.  . memantine (NAMENDA) 10 MG tablet TAKE (1)  TABLET TWICE DAILY.  . Multiple Vitamins-Minerals (THERA-M) TABS Take by mouth.  . Omega-3 Fatty Acids (FISH OIL) 1000 MG CAPS Take by mouth.  Marland Kitchen atorvastatin (LIPITOR) 40 MG tablet Take 40 mg by mouth.  . [DISCONTINUED] methocarbamol (ROBAXIN) 500 MG tablet Take 1 tablet (500 mg total) by mouth 3 (three) times daily.  . [DISCONTINUED] naproxen (NAPROSYN) 500 MG tablet Take 1 tablet (500 mg total) by mouth 2 (two) times daily with a meal.   No facility-administered encounter medications on file as of 09/08/2017.   :  Review of Systems:  Out of a complete 14 point review of systems, all are reviewed and negative with the exception of these symptoms as listed below: Review of Systems  Neurological:       Pt presents today to discuss her memory.  Pt denies any new complaints.    Objective:  Neurological Exam  Physical Exam Physical Examination:   Vitals:   09/08/17 1321  BP: 126/60  Pulse: 82    General Examination: The patient is a very pleasant 61 y.o. female in no acute distress. Well groomed.   HEENT: Normocephalic, atraumatic, pupils are equal, round and reactive to light and accommodation. Extraocular tracking shows saccadic breakdown without nystagmus noted. Hearing is intact. Face is symmetric with no facial masking and normal facial sensation, but she seems to have R facial tic, but she nor her sister were aware of it. There is no lip, neck or jaw tremor. Neck is not rigid with intact passive ROM. There are no carotid bruits on auscultation. Oropharynx exam reveals mild mouth dryness. No significant airway crowding is noted. Mallampati is class II. Tongue protrudes centrally and palate elevates symmetrically.    Chest: is clear to auscultation without wheezing, rhonchi or crackles noted.  Heart: sounds are regular and normal without murmurs, rubs or gallops noted.   Abdomen: is soft, non-tender and non-distended with normal bowel sounds appreciated on  auscultation.  Extremities: There is no pitting edema in the distal lower extremities bilaterally. Pedal pulses are intact.   Skin: is warm and dry with no trophic changes noted. Age-related changes are noted on the skin.   Musculoskeletal: exam reveals no obvious joint deformities, tenderness or joint swelling or erythema.   Neurologically:  Mental status: The patient is awake and alert, paying good attention. She is able to completely provide the history. Her sister provides details. She  is oriented to: person, place, time/date, situation, day of week, month of year and year. Her memory, attention, language and knowledge are impaired. There is no aphasia, agnosia, apraxia or anomia. There is a mild degree of bradyphrenia. Speech is mildly hypophonic with no dysarthria noted. Mood is depressed and affect is constricted.   On 08/05/14: MMSE (Mini-Mental state exam) score is 26/30. CDT (Clock Drawing Test) score is 4/4. AFT (Animal Fluency Test) score is 11. Her geriatric depression score is 8.  On 02/03/2015: MMSE: 25/30, CDT: 4/4, AFT: 21/min, GDS: 7/15.  On 07/13/2016: MMSE: 26/30, CDT: 4/4, AFT: 28/min.   On 03/07/17: MMSE: 25/30.  On 09/08/2017: MMSE: 20/30, CDT: 4/4, AFT: 16/min.   Cranial nerves are as described above under HEENT exam. In addition, shoulder shrug is normal with equal shoulder height noted.  Motor exam: Normal bulk, and strength for age is noted. Tone is not rigid with absence of cogwheeling in the extremities. There is overall no bradykinesia. There is no drift or rebound. There is no tremor. Romberg is negative. Reflexes are 2+ in the upper extremities and 2+ in the lower extremities. Toes are downgoing bilaterally. Fine motor skills: Finger taps, hand movements, and rapid alternating patting are Not impaired bilaterally. Foot taps and foot agility are Not impaired bilaterally.   Cerebellar testing shows no dysmetria or intention tremor on finger to nose testing.  Heel to shin is unremarkable. There is no truncal or gait ataxia.   Sensory exam is intact to light touch in the upper and lower extremities.   Gait, station and balance: She stands up from the seated position with no significant difficulty. No veering to one side is noted. No leaning to one side. Posture is Age-appropriate. Stance is narrow-based. She turns in 2 steps. Tandem walk is possible After initial hesitation . Balance is not particularly impaired.   Assessment and Plan:   In summary, ERMINIA MCNEW is a very pleasant 61 year old female with an underlying medical history of hypothyroidism, hypertension, recent diagnosis of type 2 diabetes, hyperlipidemia, and depression, who presents for follow up consultation of her memory loss of over 3-4 years' duration. She has has been interval medication, most recent neuropsychological evaluation from March 2018 was quite favorable, more so in the MCI range. We mutually agreed to continue with her dual medication. She has been able to tolerate generic Aricept and generic Namenda. 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 She is stable from my end of things. She had vascular changes on her brain MRI in the past. I ordered another scan, but the insurance denied it at the time. Her physical exam is nonfocal. We talked about her repeat neuropsychological testing. She does not currently feel depressed. I renewed her prescriptions for donepezil and memantine. We talked about maintaining a healthy lifestyle in general and staying active mentally and physically. I encouraged the patient to eat healthy, exercise daily and keep well hydrated, to keep a scheduled bedtime and wake time routine, to not skip any meals and eat healthy snacks in between meals and to have protein with every meal. I stressed the importance of regular exercise,  within of course the patient's own mobility limitations. I suggested a 6 month follow-up, she can see one of our nurse practitioners at the time and I will see her back after that. I answered all their questions today and the patient and her sister were  in agreement. I spent 20 minutes in total face-to-face time with the patient, more than 50% of which was spent in counseling and coordination of care, reviewing test results, reviewing medication and discussing or reviewing the diagnosis of memory loss, its prognosis and treatment options. Pertinent laboratory and imaging test results that were available during this visit with the patient were reviewed by me and considered in my medical decision making (see chart for details).

## 2017-10-20 ENCOUNTER — Other Ambulatory Visit (HOSPITAL_COMMUNITY): Payer: Self-pay | Admitting: Adult Health Nurse Practitioner

## 2017-10-20 DIAGNOSIS — Z1231 Encounter for screening mammogram for malignant neoplasm of breast: Secondary | ICD-10-CM

## 2017-11-14 ENCOUNTER — Ambulatory Visit: Payer: Medicare HMO | Admitting: Neurology

## 2017-11-24 ENCOUNTER — Encounter (HOSPITAL_COMMUNITY): Payer: Self-pay

## 2017-11-24 ENCOUNTER — Ambulatory Visit (HOSPITAL_COMMUNITY)
Admission: RE | Admit: 2017-11-24 | Discharge: 2017-11-24 | Disposition: A | Discharge status: Home / Self Care | Payer: Medicare HMO | Source: Ambulatory Visit | Attending: Adult Health Nurse Practitioner | Admitting: Adult Health Nurse Practitioner

## 2017-11-24 DIAGNOSIS — Z1231 Encounter for screening mammogram for malignant neoplasm of breast: Secondary | ICD-10-CM | POA: Diagnosis present

## 2018-03-08 NOTE — Progress Notes (Addendum)
[facility]  PATIENT: Nicole Ward DOB: [birth year]   REASON FOR VISIT: Follow-up for memory loss HISTORY FROM: Patient, Sister. [name],     HISTORY OF PRESENT ILLNESS:UPDATE 4/4/2019CM Nicole Ward, 62 year old female returns for follow-up with a history of memory loss.  She is currently on Namenda wo side effects.  She continues to live in her own home.  She continues to be independent with activities of daily living.  She has her medications in a dispenser.  She does her own cooking.  No safety issues identified.  Her sister lives 5 minutes away she does not exercise much.  She keeps a calendar with appointments etc.  She returns for reevaluation   SAToday, 09/08/2017: She reports doing okay. She does not exercise very much. She tries to hydrated. Lost her nephew recently. Tolerating meds. Need refills.    She had recent neuropsych testing with Dr. Jacquelyne Balint on 02/15/17:  FINAL DIAGNOSES:  Mild Neurocognitive Disorder (i.e., mild cognitive impairment), amnestic (rule our prodromal Alzheimer's disease versus pseudodementia) Unspecified Depressive Disorder   RECOMMENDATIONS: 1. Follow-up with Dr. Frances Furbish.  ? Could continue treatment with cholinesterase inhibitor for circumscribed memory difficulties, though I feel uncertain whether a degenerative condition is present given the 5-6 year history and the fact that memory is the only domain affected, particularly given that this would be an early onset presentation. Review of the previous neuropsychological evaluations would be valuable; they have been requested. If obtained and my opinion changes, I will amend and resend this report to her care providers.  ? Consider discontinuing memantine, as the patient is clearly not within the moderate to severe range of dementia. In fact, she does meet the clinical criteria for a dementia diagnosis.  ? Encourage follow-up care with behavioral health, as this individual appears to be  experiencing clinical symptoms of depression secondary to multiple past traumas. As such, engaging in psychotherapy is STRONGLY recommended to better manage mood symptomology.  ? Could consider a psychiatry consult ? It is recommended that the patient aggressively manage any modifiable risk factors for further cognitive decline such as strict compliance with prescribed medical treatments for any cerebrovascular risk factors.   2. Neuropsychological re-evaluation is recommended in approximately one year (or sooner) to monitor treatment efficacy, to assist with the management of the patient (start or continue rehab or pharmacological therapy), to determine any clinical and functional significance of brain abnormality over time, as well as to document any potential improvement or decline in cognitive functioning. Lastly, any follow-up testing will help delineate the specific cognitive basis of any new functional complaints. We will schedule this follow-up.   The following are several strategies that may help compensate for any real or perceived cognitive deficits. These are to be used as applicable.  ? Performance will generally be best in a structured, routine, and familiar environment, as opposed to situations involving complex problems.  ? To the extent possible, multitasking should be avoided. And if there are difficulties in organization and planning, maintaining a daily organizer to help keep track of important appointments and information may be beneficial.  ? Memory problems may at least be minimally addressed using compensatory strategies such as the use of a daily schedule to follow, memos, portable recorder, a centrally located bulletin board, or memory notebook. A large calendar, placed in a highly visible location would be valuable to keep track of dates and appointments. In addition, it would be helpful to keep a log of all of medical appointments with  the name of the doctor, date of visit,  diagnoses, and treatments.  ? Use of a medication box is recommended to ensure compliance.      UPDATE 04/02/2018CM Nicole Ward, 62 year old female returns for follow-up with history of memory loss for several years. She currently lives with her daughter who works full time. She is fairly isolated at home, she does not get out and walk. Appetite is good and she is sleeping well. Most recent MRI of the brain done in July 2016 shows mild atrophy without acute findings and no change from June 2013. No safety issues identified according to the daughter. She is currently on Aricept and Namenda without side effects. She has had neuropsych testing with Dr. Jacquelyne Balint on 02/15/2017. Test results revealed intact functioning across all cognitive domains and thinking skills with the exception of impaired learning and memory. There is also significant degree of depression secondary to multiple personal traumas. 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 There is suggestion is to continue cholinesterase inhibitor for memory difficulties but consider discontinuing Namenda as the patient is clearly not within the moderate to severe stage of dementia psychotherapy is encouraged to better manage her mood symptomology. Also consider a psychiatric consult. Neuropsych testing reevaluation recommended for one year. She returns for reevaluation 07/13/16 Nicole Ward. [name] is a 62 year old right-handed woman with an underlying medical history of hypothyroidism, hypertension, recent diagnosis of type 2 diabetes, hyperlipidemia, and depression, who presents for follow-up consultation of her memory loss. She is accompanied by her sister [name]  today. I last saw her on 02/03/2015, at which time she reported an increase in her lovastatin to 40 mg. Her sister felt that her short-term memory was worse. She had a new PCP. I suggested we proceed with a carotid Doppler ultrasound and brain MRI. She  had a ultrasound carotid bilaterally on 02/13/2015 which showed no significant stenosis involving the extracranial carotid arteries bilaterally.  REVIEW OF SYSTEMS: Full 14 system review of systems performed and notable only for those listed, all others are neg:  Constitutional: neg  Cardiovascular: neg Ear/Nose/Throat: neg  Skin: neg Eyes: Blurred vision Respiratory: neg Gastroitestinal: neg  Hematology/Lymphatic: neg  Endocrine: Intolerance to cold Musculoskeletal:neg Allergy/Immunology: neg Neurological: Memory loss Psychiatric: neg Sleep : neg   ALLERGIES: Allergies  Allergen Reactions  . Trazodone And Nefazodone Palpitations    HOME MEDICATIONS: Outpatient Medications Prior to Visit  Medication Sig Dispense Refill  . aspirin EC 81 MG tablet Take 81 mg by mouth daily.    Marland Kitchen atorvastatin (LIPITOR) 40 MG tablet Take 40 mg by mouth.    . Cholecalciferol (VITAMIN D PO) Take 1 tablet by mouth daily.    Marland Kitchen donepezil (ARICEPT) 10 MG tablet Take 1 tablet (10 mg total) by mouth at bedtime. 30 tablet 6  . levothyroxine (SYNTHROID, LEVOTHROID) 25 MCG tablet Take 25 mcg by mouth daily before breakfast.    . lisinopril (PRINIVIL,ZESTRIL) 10 MG tablet Take 10 mg by mouth daily.    . memantine (NAMENDA) 10 MG tablet TAKE (1) TABLET TWICE DAILY. 60 tablet 5  . Multiple Vitamins-Minerals (THERA-M) TABS Take by mouth.    . Omega-3 Fatty Acids (FISH OIL) 1000 MG CAPS Take by mouth.     No facility-administered medications prior to visit.     PAST MEDICAL HISTORY: Past Medical History:  Diagnosis Date  . Alzheimer's disease with early onset   . Hyperlipidemia    off meds for years  . Hypertension   .  Memory loss   . Thyroid disease 05/2014   Patient unsure if hyper or hypo    PAST SURGICAL HISTORY: Past Surgical History:  Procedure Laterality Date  . BREAST BIOPSY     benign  . COLONOSCOPY  approx 2005   patient not sure of findings or where procedure done  . PARTIAL  HYSTERECTOMY      FAMILY HISTORY: Family History  Problem Relation Age of Onset  . Colon cancer Father        late 6350s  . Liver disease Father        cirrhosis, etoh    SOCIAL HISTORY: Social History   Socioeconomic History  . Marital status: Divorced    Spouse name: Not on file  . Number of children: 1  . Years of education: 5112  . Highest education level: Not on file  Occupational History    Employer: [email or web address]  Social Needs  . Financial resource strain: Not on file  . Food insecurity:    Worry: Not on file    Inability: Not on file  . Transportation needs:    Medical: Not on file    Non-medical: Not on file  Tobacco Use  . Smoking status: Never Smoker  . Smokeless tobacco: Never Used  Substance and Sexual Activity  . Alcohol use: No    Alcohol/week: 0.0 oz  . Drug use: No  . Sexual activity: Not on file  Lifestyle  . Physical activity:    Days per week: Not on file    Minutes per session: Not on file  . Stress: Not on file  Relationships  . Social connections:    Talks on phone: Not on file    Gets together: Not on file    Attends religious service: Not on file    Active member of club or organization: Not on file    Attends meetings of clubs or organizations: Not on file    Relationship status: Not on file  . Intimate partner violence:    Fear of current or ex partner: Not on file    Emotionally abused: Not on file    Physically abused: Not on file    Forced sexual activity: Not on file  Other Topics Concern  . Not on file  Social History Narrative   Patient states she remarried her husband in [id number]. States he is in jail for cocaine use. Under a lot of stress.     PHYSICAL EXAM  Vitals:   03/09/18 1321  BP: 116/69  Pulse: 78  Weight: 165 lb 12.8 oz (75.2 kg)  Height: 5\' 6"  (1.676 m)   Body mass index is 26.76 kg/m.  Generalized: Well developed, in no acute distress  Head: normocephalic and atraumatic,. Oropharynx benign  Neck: Supple, no  carotid bruits  Cardiac: Regular rate rhythm, no murmur  Musculoskeletal: No deformity   Neurological examination   Mentation: Alert .AFT 8 Clock drawing 4/4Follows all commands speech and language fluent.    MMSE - Mini Mental State Exam 03/09/2018 03/07/2017 07/13/2016  Orientation to time 3 3 3   Orientation to Place 2 5 5   Registration 3 3 3   Attention/ Calculation 4 5 3   Recall 1 1 1   Language- name 2 objects 2 2 2   Language- repeat 1 0 1  Language- follow 3 step command 3 3 3   Language- read & follow direction 1 1 1   Write a sentence 0 1 1  Copy design 1 1 1   Total score 21  25 24    Cranial nerve II-XII: Pupils were equal round reactive to light extraocular movements were full, visual field were full on confrontational test. Facial sensation and strength were normal. hearing was intact to finger rubbing bilaterally. Uvula tongue midline. head turning and shoulder shrug were normal and symmetric.Tongue protrusion into cheek strength was normal. Motor: normal bulk and tone, full strength in the BUE, BLE, Sensory: normal and symmetric to light touch, pinprick, and  Vibration,  in the upper and lower extremities Coordination : finger-nose-finger, heel-to-shin bilaterally, no dysmetria, no tremor Reflexes: Brachioradialis 2/2, biceps 2/2, triceps 2/2, patellar 2/2, Achilles 2/2, plantar responses were flexor bilaterally. Gait and Station: Rising up from seated position without assistance, normal stance,  moderate stride, good arm swing, smooth turning, able to perform tiptoe, and heel walking without difficulty. Tandem gait is steady. No assistive device   DIAGNOSTIC DATA (LABS, IMAGING, TESTING) - I reviewed patient records, labs, notes, testing and imaging myself where available.     ASSESSMENT AND PLAN LEYLI KEVORKIAN is a very pleasant 62 year old female with an underlying medical history of hypothyroidism, hypertension, recent diagnosis of type 2 diabetes, hyperlipidemia, and  depression, who presents for follow up consultation of her memory loss of over 3-4 years' duration. Most recent neuropsychological evaluation from March 2018 was quite favorable, more so in the MCI range.  She has been able to tolerate generic Aricept and generic Namenda. 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 She is stable.. Her physical exam is nonfocal.      PLAN: Continue Aricept and Namenda at current doses will refill Regular  Exercise good nutrition Performance of activities will be best in a structured routine and familiar environment Multitasking should be avoided Use a daily planner.   If Having trouble administering medications using a medication box   is  recommended to ensure compliance   Follow up in 6 months  I spent 25 minutes in total face to face time with the patient and daughter and sister more than 50% of which was spent counseling and coordination of care, reviewing recent neuropsych testing and recommendations , reviewing medications and discussing and reviewing the diagnosis of memory loss.    Nilda Riggs, [facility], [facility], APRN  [facility] [address] [place], [place] [address] [phone]  I reviewed the above note and documentation by the Nurse Practitioner and agree with the history, physical exam, assessment and plan as outlined above. I was immediately available for face-to-face consultation. [name], MD, PhD [facility])

## 2018-03-09 ENCOUNTER — Encounter (INDEPENDENT_AMBULATORY_CARE_PROVIDER_SITE_OTHER): Payer: Self-pay

## 2018-03-09 ENCOUNTER — Encounter: Payer: Self-pay | Admitting: Nurse Practitioner

## 2018-03-09 ENCOUNTER — Ambulatory Visit: Payer: Medicare HMO | Admitting: Nurse Practitioner

## 2018-03-09 DIAGNOSIS — R413 Other amnesia: Secondary | ICD-10-CM

## 2018-03-09 MED ORDER — DONEPEZIL HCL 10 MG PO TABS: 10.0000 mg | ORAL_TABLET | Freq: Every day | ORAL | 6 refills | Status: DC

## 2018-03-09 MED ORDER — MEMANTINE HCL 10 MG PO TABS
ORAL_TABLET | ORAL | 6 refills | Status: DC
Start: 2018-03-09 — End: 2018-09-19

## 2018-03-09 NOTE — Patient Instructions (Signed)
Continue Aricept and Namenda at current doses will refill Regular  Exercise good nutrition Performance of activities will be best in a structured routine and familiar environment Multitasking should be avoided Use a daily planner.   If Having trouble administering medications using a medication box   is  recommended to ensure compliance   Follow up in 6 months

## 2018-04-03 ENCOUNTER — Other Ambulatory Visit: Payer: Self-pay | Admitting: Neurology

## 2018-04-03 DIAGNOSIS — R9082 White matter disease, unspecified: Secondary | ICD-10-CM

## 2018-04-03 DIAGNOSIS — R413 Other amnesia: Secondary | ICD-10-CM

## 2018-04-10 ENCOUNTER — Other Ambulatory Visit: Payer: Self-pay | Admitting: Neurology

## 2018-04-10 DIAGNOSIS — R413 Other amnesia: Secondary | ICD-10-CM

## 2018-04-10 DIAGNOSIS — R9082 White matter disease, unspecified: Secondary | ICD-10-CM

## 2018-05-15 ENCOUNTER — Other Ambulatory Visit: Payer: Self-pay | Admitting: Nurse Practitioner

## 2018-05-30 ENCOUNTER — Encounter (INDEPENDENT_AMBULATORY_CARE_PROVIDER_SITE_OTHER): Payer: Self-pay | Admitting: *Deleted

## 2018-07-06 ENCOUNTER — Encounter (INDEPENDENT_AMBULATORY_CARE_PROVIDER_SITE_OTHER): Payer: Self-pay | Admitting: *Deleted

## 2018-07-12 ENCOUNTER — Other Ambulatory Visit (INDEPENDENT_AMBULATORY_CARE_PROVIDER_SITE_OTHER): Payer: Self-pay | Admitting: *Deleted

## 2018-07-12 DIAGNOSIS — Z800 Family history of malignant neoplasm of digestive organs: Secondary | ICD-10-CM | POA: Insufficient documentation

## 2018-07-12 DIAGNOSIS — Z1211 Encounter for screening for malignant neoplasm of colon: Secondary | ICD-10-CM | POA: Insufficient documentation

## 2018-09-05 ENCOUNTER — Telehealth (INDEPENDENT_AMBULATORY_CARE_PROVIDER_SITE_OTHER): Payer: Self-pay | Admitting: *Deleted

## 2018-09-05 ENCOUNTER — Encounter (INDEPENDENT_AMBULATORY_CARE_PROVIDER_SITE_OTHER): Payer: Self-pay | Admitting: *Deleted

## 2018-09-05 NOTE — Telephone Encounter (Signed)
Patient needs suprep 

## 2018-09-06 MED ORDER — SUPREP BOWEL PREP KIT 17.5-3.13-1.6 GM/177ML PO SOLN: 1.0000 | Freq: Once | ORAL | 0 refills | Status: AC

## 2018-09-12 ENCOUNTER — Telehealth: Payer: Self-pay | Admitting: Nurse Practitioner

## 2018-09-12 NOTE — Telephone Encounter (Signed)
LVM for patient's sister, Mora Bellman requesting call back to discuss.

## 2018-09-12 NOTE — Telephone Encounter (Signed)
Pt sister([name] LOWE-on DPR) is calling re: pt's donepezil (ARICEPT) 10 MG tablet.  Pt sister is stating that she had pt's mother has this exact prescription same mg and everything.   [name] LOWE states that their mother has been taken off of this medication yet has a bottle not opened or used.  She is asking if her sister could use this medication.  Please call

## 2018-09-15 ENCOUNTER — Telehealth (INDEPENDENT_AMBULATORY_CARE_PROVIDER_SITE_OTHER): Payer: Self-pay | Admitting: *Deleted

## 2018-09-15 NOTE — Telephone Encounter (Signed)
Referring MD/PCP: Sharon Seller, np   Procedure: tcs  Reason/Indication: screening, fam hx colon ca  Has patient had this procedure before?  no  If so, when, by whom and where?    Is there a family history of colon cancer?  Yes, mother  Who?  What age when diagnosed?    Is patient diabetic?   tes      Does patient have prosthetic heart valve or mechanical valve?  no  Do you have a pacemaker?  no  Has patient ever had endocarditis? no  Has patient had joint replacement within last 12 months?  no  Is patient constipated or do they take laxatives? no  Does patient have a history of alcohol/drug use?  no  Is patient on blood thinner such as Coumadin, Plavix and/or Aspirin? yes  Medications: asa 81 mg daily, atorvastatin 40 mg daily, levothyroxine 25 mg daily, glipizide 2.5 mg daily, amlodipine 5 mg daily, namenda 10 mg bid, fish oil, mvi, donepezil 10 mg nightly  Allergies: trazodone, nefazodone  Medication Adjustment per Dr Keane Police, NP: asa 2 days, don't take glipizide evening before and morning of  Procedure date & time: 10/11/18 at 930

## 2018-09-18 NOTE — Progress Notes (Addendum)
[facility]  PATIENT: Nicole Ward DOB: 1956/08/17   REASON FOR VISIT: Follow-up for memory loss HISTORY FROM: Patient, Sister. [name],     HISTORY OF PRESENT ILLNESS:UPDATE 10/15/2019CM Nicole Ward, 62 year old female returns for follow-up with memory loss.  She is currently on and Aricept without side effects.  She lives with her daughter in her own home.  She no longer drives.  She no longer cooks.  She is independent in activities of daily living.  Her meds are in a medication dispenser.  No safety issues have been identified.  She keeps a calendar in her bedroom with appointments etc. she gets little exercise and was encouraged to do so.  She returns for reevaluation    UPDATE 4/4/2019CM Nicole Ward, 62 year old female returns for follow-up with a history of memory loss.  She is currently on Namenda wo side effects.  She continues to live in her own home.  She continues to be independent with activities of daily living.  She has her medications in a dispenser.  She does her own cooking.  No safety issues identified.  Her sister lives 5 minutes away she does not exercise much.  She keeps a calendar with appointments etc.  She returns for reevaluation   SAToday, 09/08/2017: She reports doing okay. She does not exercise very much. She tries to hydrated. Lost her nephew recently. Tolerating meds. Need refills.    She had recent neuropsych testing with Dr. Jacquelyne Balint on 02/15/17:  FINAL DIAGNOSES:  Mild Neurocognitive Disorder (i.e., mild cognitive impairment), amnestic (rule our prodromal Alzheimer's disease versus pseudodementia) Unspecified Depressive Disorder   RECOMMENDATIONS: 1. Follow-up with Dr. Frances Furbish.  ? Could continue treatment with cholinesterase inhibitor for circumscribed memory difficulties, though I feel uncertain whether a degenerative condition is present given the 5-6 year history and the fact that memory is the only domain affected, particularly  given that this would be an early onset presentation. Review of the previous neuropsychological evaluations would be valuable; they have been requested. If obtained and my opinion changes, I will amend and resend this report to her care providers.  ? Consider discontinuing memantine, as the patient is clearly not within the moderate to severe range of dementia. In fact, she does meet the clinical criteria for a dementia diagnosis.  ? Encourage follow-up care with behavioral health, as this individual appears to be experiencing clinical symptoms of depression secondary to multiple past traumas. As such, engaging in psychotherapy is STRONGLY recommended to better manage mood symptomology.  ? Could consider a psychiatry consult ? It is recommended that the patient aggressively manage any modifiable risk factors for further cognitive decline such as strict compliance with prescribed medical treatments for any cerebrovascular risk factors.   2. Neuropsychological re-evaluation is recommended in approximately one year (or sooner) to monitor treatment efficacy, to assist with the management of the patient (start or continue rehab or pharmacological therapy), to determine any clinical and functional significance of brain abnormality over time, as well as to document any potential improvement or decline in cognitive functioning. Lastly, any follow-up testing will help delineate the specific cognitive basis of any new functional complaints. We will schedule this follow-up.   The following are several strategies that may help compensate for any real or perceived cognitive deficits. These are to be used as applicable.  ? Performance will generally be best in a structured, routine, and familiar environment, as opposed to situations involving complex problems.  ? To the extent possible, multitasking should be  avoided. And if there are difficulties in organization and planning, maintaining a daily organizer to help keep  track of important appointments and information may be beneficial.  ? Memory problems may at least be minimally addressed using compensatory strategies such as the use of a daily schedule to follow, memos, portable recorder, a centrally located bulletin board, or memory notebook. A large calendar, placed in a highly visible location would be valuable to keep track of dates and appointments. In addition, it would be helpful to keep a log of all of medical appointments with the name of the doctor, date of visit, diagnoses, and treatments.  ? Use of a medication box is recommended to ensure compliance.      UPDATE 04/02/2018CM Nicole Ward, 62 year old female returns for follow-up with history of memory loss for several years. She currently lives with her daughter who works full time. She is fairly isolated at home, she does not get out and walk. Appetite is good and she is sleeping well. Most recent MRI of the brain done in July 2016 shows mild atrophy without acute findings and no change from June 2013. No safety issues identified according to the daughter. She is currently on Aricept and Namenda without side effects. She has had neuropsych testing with Dr. Jacquelyne Balint on 02/15/2017. Test results revealed intact functioning across all cognitive domains and thinking skills with the exception of impaired learning and memory. There is also significant degree of depression secondary to multiple personal traumas. 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 There is suggestion is to continue cholinesterase inhibitor for memory difficulties but consider discontinuing Namenda as the patient is clearly not within the moderate to severe stage of dementia psychotherapy is encouraged to better manage her mood symptomology. Also consider a psychiatric consult. Neuropsych testing reevaluation recommended for one year. She returns for reevaluation 07/13/16 SAMs. Nicole Ward is a  62 year old right-handed woman with an underlying medical history of hypothyroidism, hypertension, recent diagnosis of type 2 diabetes, hyperlipidemia, and depression, who presents for follow-up consultation of her memory loss. She is accompanied by her sister [name]  today. I last saw her on 02/03/2015, at which time she reported an increase in her lovastatin to 40 mg. Her sister felt that her short-term memory was worse. She had a new PCP. I suggested we proceed with a carotid Doppler ultrasound and brain MRI. She had a ultrasound carotid bilaterally on 02/13/2015 which showed no significant stenosis involving the extracranial carotid arteries bilaterally.  REVIEW OF SYSTEMS: Full 14 system review of systems performed and notable only for those listed, all others are neg:  Constitutional: neg  Cardiovascular: neg Ear/Nose/Throat: neg  Skin: neg Eyes: Blurred vision Respiratory: neg Gastroitestinal: neg  Hematology/Lymphatic: neg  Endocrine: Intolerance to cold Musculoskeletal:neg Allergy/Immunology: neg Neurological: Memory loss Psychiatric: neg Sleep : neg   ALLERGIES: Allergies  Allergen Reactions  . Trazodone And Nefazodone Palpitations    HOME MEDICATIONS: Outpatient Medications Prior to Visit  Medication Sig Dispense Refill  . amLODipine (NORVASC) 5 MG tablet Take 5 mg by mouth daily.    Marland Kitchen aspirin EC 81 MG tablet Take 81 mg by mouth daily.    . Cholecalciferol (VITAMIN D PO) Take 1 tablet by mouth daily.    Marland Kitchen donepezil (ARICEPT) 10 MG tablet Take 1 tablet (10 mg total) by mouth at bedtime. 30 tablet 6  . levothyroxine (SYNTHROID, LEVOTHROID) 25 MCG tablet Take 25 mcg by mouth daily before breakfast.    . memantine (NAMENDA)  10 MG tablet TAKE (1) TABLET TWICE DAILY. 60 tablet 6  . Multiple Vitamins-Minerals (THERA-M) TABS Take by mouth.    . Omega-3 Fatty Acids (FISH OIL) 1000 MG CAPS Take by mouth.    Marland Kitchen atorvastatin (LIPITOR) 40 MG tablet Take 40 mg by mouth.    Marland Kitchen lisinopril  (PRINIVIL,ZESTRIL) 10 MG tablet Take 10 mg by mouth daily.     No facility-administered medications prior to visit.     PAST MEDICAL HISTORY: Past Medical History:  Diagnosis Date  . Alzheimer's disease with early onset (HCC)   . Hyperlipidemia    off meds for years  . Hypertension   . Memory loss   . Thyroid disease 05/2014   Patient unsure if hyper or hypo    PAST SURGICAL HISTORY: Past Surgical History:  Procedure Laterality Date  . BREAST BIOPSY     benign  . COLONOSCOPY  approx 2005   patient not sure of findings or where procedure done  . PARTIAL HYSTERECTOMY      FAMILY HISTORY: Family History  Problem Relation Age of Onset  . Colon cancer Father        late 32s  . Liver disease Father        cirrhosis, etoh    SOCIAL HISTORY: Social History   Socioeconomic History  . Marital status: Divorced    Spouse name: Not on file  . Number of children: 1  . Years of education: 61  . Highest education level: Not on file  Occupational History    Employer: [employer]  Social Needs  . Financial resource strain: Not on file  . Food insecurity:    Worry: Not on file    Inability: Not on file  . Transportation needs:    Medical: Not on file    Non-medical: Not on file  Tobacco Use  . Smoking status: Never Smoker  . Smokeless tobacco: Never Used  Substance and Sexual Activity  . Alcohol use: No    Alcohol/week: 0.0 standard drinks  . Drug use: No  . Sexual activity: Not on file  Lifestyle  . Physical activity:    Days per week: Not on file    Minutes per session: Not on file  . Stress: Not on file  Relationships  . Social connections:    Talks on phone: Not on file    Gets together: Not on file    Attends religious service: Not on file    Active member of club or organization: Not on file    Attends meetings of clubs or organizations: Not on file    Relationship status: Not on file  . Intimate partner violence:    Fear of current or ex partner: Not on  file    Emotionally abused: Not on file    Physically abused: Not on file    Forced sexual activity: Not on file  Other Topics Concern  . Not on file  Social History Narrative   Patient states she remarried her husband in 69. States he is in jail for cocaine use. Under a lot of stress.     PHYSICAL EXAM  Vitals:   09/19/18 1433  BP: 117/73  Pulse: 66  Weight: 160 lb 12.8 oz (72.9 kg)  Height: 5\' 6"  (1.676 m)   Body mass index is 25.95 kg/m.  Generalized: Well developed, in no acute distress  Head: normocephalic and atraumatic,. Oropharynx benign  Neck: Supple,   Musculoskeletal: No deformity   Neurological examination  Mentation: Alert . Clock drawing 4/4Follows all commands speech and language fluent.    MMSE - Mini Mental State Exam 09/19/2018 03/09/2018 03/07/2017  Not completed: (No Data) - -  Orientation to time 4 3 3   Orientation to Place 4 2 5   Registration 3 3 3   Attention/ Calculation 5 4 5   Recall 0 1 1  Language- name 2 objects 2 2 2   Language- repeat 1 1 0  Language- follow 3 step command 3 3 3   Language- read & follow direction 1 1 1   Write a sentence 1 0 1  Copy design 1 1 1   Total score 25 21 25     Cranial nerve II-XII: Pupils were equal round reactive to light extraocular movements were full, visual field were full on confrontational test. Facial sensation and strength were normal. hearing was intact to finger rubbing bilaterally. Uvula tongue midline. head turning and shoulder shrug were normal and symmetric.Tongue protrusion into cheek strength was normal. Motor: normal bulk and tone, full strength in the BUE, BLE, Sensory: normal and symmetric to light touch,   in the upper and lower extremities Coordination : finger-nose-finger, heel-to-shin bilaterally, no dysmetria, no tremor Reflexes: Symmetric upper and lower, plantar responses were flexor bilaterally. Gait and Station: Rising up from seated position without assistance, normal stance,   moderate stride, good arm swing, smooth turning, able to perform tiptoe, and heel walking without difficulty. Tandem gait is steady. No assistive device   DIAGNOSTIC DATA (LABS, IMAGING, TESTING) - I reviewed patient records, labs, notes, testing and imaging myself where available.     ASSESSMENT AND PLAN TERRICKA ONOFRIO is a very pleasant 62 year old female with an underlying medical history of hypothyroidism, hypertension, recent diagnosis of type 2 diabetes, hyperlipidemia, and depression, who presents for follow up consultation of her memory loss of over 3-4 years' duration. Most recent neuropsychological evaluation from March 2018 was quite favorable, more so in the MCI range.  She has been able to tolerate generic Aricept and generic Namenda. 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 She is stable.. Her physical exam is nonfocal.      PLAN: Continue Aricept and Namenda at current doses will refill Regular  Exercise good nutrition Performance of activities will be best in a structured routine and familiar environment Multitasking should be avoided Use a daily planner.    Follow up in 6 months  I spent 25 minutes in total face to face time with the patient and daughter and sister more than 50% of which was spent counseling and coordination of care, , reviewing medications and discussing and reviewing the diagnosis of memory loss.    Nilda Riggs, [facility], [facility], APRN  [facility] [address] [place], [place] [address] [phone]  I reviewed the above note and documentation by the Nurse Practitioner and agree with the history, physical exam, assessment and plan as outlined above.  [name], MD, PhD [facility])

## 2018-09-18 NOTE — Telephone Encounter (Signed)
agree

## 2018-09-19 ENCOUNTER — Encounter: Payer: Self-pay | Admitting: Nurse Practitioner

## 2018-09-19 ENCOUNTER — Ambulatory Visit: Payer: Medicare HMO | Admitting: Nurse Practitioner

## 2018-09-19 VITALS — BP 117/73 | HR 66 | Ht 66.0 in | Wt 160.8 lb

## 2018-09-19 DIAGNOSIS — R413 Other amnesia: Secondary | ICD-10-CM

## 2018-09-19 MED ORDER — MEMANTINE HCL 10 MG PO TABS
ORAL_TABLET | ORAL | 6 refills | Status: DC
Start: 2018-09-19 — End: 2018-12-04

## 2018-09-19 NOTE — Patient Instructions (Signed)
Continue Aricept and Namenda at current doses will refill Regular  Exercise good nutrition Performance of activities will be best in a structured routine and familiar environment Multitasking should be avoided Use a daily planner.    Follow up in 6 months

## 2018-10-02 ENCOUNTER — Other Ambulatory Visit: Payer: Self-pay | Admitting: Nurse Practitioner

## 2018-10-11 ENCOUNTER — Encounter (HOSPITAL_COMMUNITY): Payer: Self-pay | Admitting: *Deleted

## 2018-10-11 ENCOUNTER — Ambulatory Visit (HOSPITAL_COMMUNITY)
Admission: RE | Admit: 2018-10-11 | Discharge: 2018-10-11 | Disposition: A | Discharge status: Home / Self Care | Payer: Medicare HMO | Source: Ambulatory Visit | Attending: Internal Medicine | Admitting: Internal Medicine

## 2018-10-11 ENCOUNTER — Other Ambulatory Visit: Payer: Self-pay

## 2018-10-11 ENCOUNTER — Encounter (HOSPITAL_COMMUNITY)
Admission: RE | Disposition: A | Discharge status: Home / Self Care | Payer: Self-pay | Source: Ambulatory Visit | Attending: Internal Medicine

## 2018-10-11 DIAGNOSIS — Z79899 Other long term (current) drug therapy: Secondary | ICD-10-CM | POA: Insufficient documentation

## 2018-10-11 DIAGNOSIS — K5730 Diverticulosis of large intestine without perforation or abscess without bleeding: Secondary | ICD-10-CM | POA: Diagnosis not present

## 2018-10-11 DIAGNOSIS — Z7982 Long term (current) use of aspirin: Secondary | ICD-10-CM | POA: Diagnosis not present

## 2018-10-11 DIAGNOSIS — Z800 Family history of malignant neoplasm of digestive organs: Secondary | ICD-10-CM

## 2018-10-11 DIAGNOSIS — I10 Essential (primary) hypertension: Secondary | ICD-10-CM | POA: Diagnosis not present

## 2018-10-11 DIAGNOSIS — Z1211 Encounter for screening for malignant neoplasm of colon: Secondary | ICD-10-CM | POA: Insufficient documentation

## 2018-10-11 DIAGNOSIS — E079 Disorder of thyroid, unspecified: Secondary | ICD-10-CM | POA: Diagnosis not present

## 2018-10-11 DIAGNOSIS — Z90710 Acquired absence of both cervix and uterus: Secondary | ICD-10-CM | POA: Diagnosis not present

## 2018-10-11 DIAGNOSIS — F0280 Dementia in other diseases classified elsewhere without behavioral disturbance: Secondary | ICD-10-CM | POA: Diagnosis not present

## 2018-10-11 DIAGNOSIS — E785 Hyperlipidemia, unspecified: Secondary | ICD-10-CM | POA: Insufficient documentation

## 2018-10-11 DIAGNOSIS — Z7984 Long term (current) use of oral hypoglycemic drugs: Secondary | ICD-10-CM | POA: Diagnosis not present

## 2018-10-11 DIAGNOSIS — Z888 Allergy status to other drugs, medicaments and biological substances status: Secondary | ICD-10-CM | POA: Diagnosis not present

## 2018-10-11 DIAGNOSIS — G309 Alzheimer's disease, unspecified: Secondary | ICD-10-CM | POA: Diagnosis not present

## 2018-10-11 DIAGNOSIS — Z8379 Family history of other diseases of the digestive system: Secondary | ICD-10-CM | POA: Insufficient documentation

## 2018-10-11 DIAGNOSIS — E119 Type 2 diabetes mellitus without complications: Secondary | ICD-10-CM | POA: Diagnosis not present

## 2018-10-11 HISTORY — PX: COLONOSCOPY: SHX5424

## 2018-10-11 HISTORY — DX: Type 2 diabetes mellitus without complications: E11.9

## 2018-10-11 SURGERY — COLONOSCOPY
Anesthesia: Moderate Sedation

## 2018-10-11 MED ORDER — STERILE WATER FOR IRRIGATION IR SOLN
Status: DC | PRN
  Administered 2018-10-11: 1.5 mL

## 2018-10-11 MED ORDER — MEPERIDINE HCL 50 MG/ML IJ SOLN
INTRAMUSCULAR | Status: DC | PRN
  Administered 2018-10-11 (×2): 25 mg via INTRAVENOUS

## 2018-10-11 MED ORDER — MIDAZOLAM HCL 5 MG/5ML IJ SOLN
INTRAMUSCULAR | Status: DC | PRN
  Administered 2018-10-11 (×2): 2 mg via INTRAVENOUS

## 2018-10-11 MED ORDER — SODIUM CHLORIDE 0.9 % IV SOLN
INTRAVENOUS | Status: DC
  Administered 2018-10-11: 1000 mL via INTRAVENOUS

## 2018-10-11 MED ORDER — MEPERIDINE HCL 50 MG/ML IJ SOLN
INTRAMUSCULAR | Status: AC
  Filled 2018-10-11: qty 1

## 2018-10-11 MED ORDER — MIDAZOLAM HCL 5 MG/5ML IJ SOLN
INTRAMUSCULAR | Status: AC
  Filled 2018-10-11: qty 10

## 2018-10-11 NOTE — Discharge Instructions (Signed)
Resume usual medications including aspirin as before. Modified carb high-fiber diet. No driving for 24 hours. Next colonoscopy in 10 years.      Colonoscopy, Adult, Care After This sheet gives you information about how to care for yourself after your procedure. Your doctor may also give you more specific instructions. If you have problems or questions, call your doctor. Follow these instructions at home: General instructions   For the first 24 hours after the procedure: ? Do not drive or use machinery. ? Do not sign important documents. ? Do not drink alcohol. ? Do your daily activities more slowly than normal. ? Eat foods that are soft and easy to digest. ? Rest often.  Take over-the-counter or prescription medicines only as told by your doctor.  It is up to you to get the results of your procedure. Ask your doctor, or the department performing the procedure, when your results will be ready. To help cramping and bloating:  Try walking around.  Put heat on your belly (abdomen) as told by your doctor. Use a heat source that your doctor recommends, such as a moist heat pack or a heating pad. ? Put a towel between your skin and the heat source. ? Leave the heat on for 20-30 minutes. ? Remove the heat if your skin turns bright red. This is especially important if you cannot feel pain, heat, or cold. You can get burned. Eating and drinking  Drink enough fluid to keep your pee (urine) clear or pale yellow.  Return to your normal diet as told by your doctor. Avoid heavy or fried foods that are hard to digest.  Avoid drinking alcohol for as long as told by your doctor. Contact a doctor if:  You have blood in your poop (stool) 2-3 days after the procedure. Get help right away if:  You have more than a small amount of blood in your poop.  You see large clumps of tissue (blood clots) in your poop.  Your belly is swollen.  You feel sick to your stomach (nauseous).  You throw  up (vomit).  You have a fever.  You have belly pain that gets worse, and medicine does not help your pain. This information is not intended to replace advice given to you by your health care provider. Make sure you discuss any questions you have with your health care provider. Document Released: 12/25/2010 Document Revised: 08/16/2016 Document Reviewed: 08/16/2016 Elsevier Interactive Patient Education  2017 Elsevier Inc.     Diverticulosis Diverticulosis is a condition that develops when small pouches (diverticula) form in the wall of the large intestine (colon). The colon is where water is absorbed and stool is formed. The pouches form when the inside layer of the colon pushes through weak spots in the outer layers of the colon. You may have a few pouches or many of them. What are the causes? The cause of this condition is not known. What increases the risk? The following factors may make you more likely to develop this condition:  Being older than age 81. Your risk for this condition increases with age. Diverticulosis is rare among people younger than age 29. By age 71, many people have it.  Eating a low-fiber diet.  Having frequent constipation.  Being overweight.  Not getting enough exercise.  Smoking.  Taking over-the-counter pain medicines, like aspirin and ibuprofen.  Having a family history of diverticulosis.  What are the signs or symptoms? In most people, there are no symptoms of this condition.  If you do have symptoms, they may include:  Bloating.  Cramps in the abdomen.  Constipation or diarrhea.  Pain in the lower left side of the abdomen.  How is this diagnosed? This condition is most often diagnosed during an exam for other colon problems. Because diverticulosis usually has no symptoms, it often cannot be diagnosed independently. This condition may be diagnosed by:  Using a flexible scope to examine the colon (colonoscopy).  Taking an X-ray of the  colon after dye has been put into the colon (barium enema).  Doing a CT scan.  How is this treated? You may not need treatment for this condition if you have never developed an infection related to diverticulosis. If you have had an infection before, treatment may include:  Eating a high-fiber diet. This may include eating more fruits, vegetables, and grains.  Taking a fiber supplement.  Taking a live bacteria supplement (probiotic).  Taking medicine to relax your colon.  Taking antibiotic medicines.  Follow these instructions at home:  Drink 6-8 glasses of water or more each day to prevent constipation.  Try not to strain when you have a bowel movement.  If you have had an infection before: ? Eat more fiber as directed by your health care provider or your diet and nutrition specialist (dietitian). ? Take a fiber supplement or probiotic, if your health care provider approves.  Take over-the-counter and prescription medicines only as told by your health care provider.  If you were prescribed an antibiotic, take it as told by your health care provider. Do not stop taking the antibiotic even if you start to feel better.  Keep all follow-up visits as told by your health care provider. This is important. Contact a health care provider if:  You have pain in your abdomen.  You have bloating.  You have cramps.  You have not had a bowel movement in 3 days. Get help right away if:  Your pain gets worse.  Your bloating becomes very bad.  You have a fever or chills, and your symptoms suddenly get worse.  You vomit.  You have bowel movements that are bloody or black.  You have bleeding from your rectum. Summary  Diverticulosis is a condition that develops when small pouches (diverticula) form in the wall of the large intestine (colon).  You may have a few pouches or many of them.  This condition is most often diagnosed during an exam for other colon problems.  If you  have had an infection related to diverticulosis, treatment may include increasing the fiber in your diet, taking supplements, or taking medicines. This information is not intended to replace advice given to you by your health care provider. Make sure you discuss any questions you have with your health care provider. Document Released: 08/19/2004 Document Revised: 10/11/2016 Document Reviewed: 10/11/2016 Elsevier Interactive Patient Education  2017 Elsevier Inc.     High-Fiber Diet Fiber, also called dietary fiber, is a type of carbohydrate found in fruits, vegetables, whole grains, and beans. A high-fiber diet can have many health benefits. Your health care provider may recommend a high-fiber diet to help:  Prevent constipation. Fiber can make your bowel movements more regular.  Lower your cholesterol.  Relieve hemorrhoids, uncomplicated diverticulosis, or irritable bowel syndrome.  Prevent overeating as part of a weight-loss plan.  Prevent heart disease, type 2 diabetes, and certain cancers.  What is my plan? The recommended daily intake of fiber includes:  38 grams for men under age 27.  30 grams for men over age 5.  25 grams for women under age 2.  21 grams for women over age 79.  You can get the recommended daily intake of dietary fiber by eating a variety of fruits, vegetables, grains, and beans. Your health care provider may also recommend a fiber supplement if it is not possible to get enough fiber through your diet. What do I need to know about a high-fiber diet?  Fiber supplements have not been widely studied for their effectiveness, so it is better to get fiber through food sources.  Always check the fiber content on thenutrition facts label of any prepackaged food. Look for foods that contain at least 5 grams of fiber per serving.  Ask your dietitian if you have questions about specific foods that are related to your condition, especially if those foods are not  listed in the following section.  Increase your daily fiber consumption gradually. Increasing your intake of dietary fiber too quickly may cause bloating, cramping, or gas.  Drink plenty of water. Water helps you to digest fiber. What foods can I eat? Grains Whole-grain breads. Multigrain cereal. Oats and oatmeal. Brown rice. Barley. Bulgur wheat. Millet. Bran muffins. Popcorn. Rye wafer crackers. Vegetables Sweet potatoes. Spinach. Kale. Artichokes. Cabbage. Broccoli. Green peas. Carrots. Squash. Fruits Berries. Pears. Apples. Oranges. Avocados. Prunes and raisins. Dried figs. Meats and Other Protein Sources Navy, kidney, pinto, and soy beans. Split peas. Lentils. Nuts and seeds. Dairy Fiber-fortified yogurt. Beverages Fiber-fortified soy milk. Fiber-fortified orange juice. Other Fiber bars. The items listed above may not be a complete list of recommended foods or beverages. Contact your dietitian for more options. What foods are not recommended? Grains White bread. Pasta made with refined flour. White rice. Vegetables Fried potatoes. Canned vegetables. Well-cooked vegetables. Fruits Fruit juice. Cooked, strained fruit. Meats and Other Protein Sources Fatty cuts of meat. Fried Environmental education officer or fried fish. Dairy Milk. Yogurt. Cream cheese. Sour cream. Beverages Soft drinks. Other Cakes and pastries. Butter and oils. The items listed above may not be a complete list of foods and beverages to avoid. Contact your dietitian for more information. What are some tips for including high-fiber foods in my diet?  Eat a wide variety of high-fiber foods.  Make sure that half of all grains consumed each day are whole grains.  Replace breads and cereals made from refined flour or white flour with whole-grain breads and cereals.  Replace white rice with brown rice, bulgur wheat, or millet.  Start the day with a breakfast that is high in fiber, such as a cereal that contains at least 5 grams  of fiber per serving.  Use beans in place of meat in soups, salads, or pasta.  Eat high-fiber snacks, such as berries, raw vegetables, nuts, or popcorn. This information is not intended to replace advice given to you by your health care provider. Make sure you discuss any questions you have with your health care provider. Document Released: 11/22/2005 Document Revised: 04/29/2016 Document Reviewed: 05/07/2014 Elsevier Interactive Patient Education  [employer].

## 2018-10-11 NOTE — H&P (Signed)
Nicole Ward is an 62 y.o. female.   Chief Complaint: Patient is here for colonoscopy. HPI: Patient is 62 year old female who is here for screening colonoscopy.  She denies abdominal pain change in bowel habits or rectal bleeding.  Family history significant for CRC in mother who was around 6 at the time of diagnosis.  Her old records indicate that father had colon cancer.  Her history is corroborated by her sister will be who is accompanying the patient today.  Last colonoscopy was unremarkable in 2012. Patient states she has early onset of dementia.  She is deemed to be competent and has signed informed consent.  Past Medical History:  Diagnosis Date  . Alzheimer's disease with early onset (HCC)   . Diabetes mellitus without complication (HCC)   . Hyperlipidemia    off meds for years  . Hypertension   . Memory loss   . Thyroid disease 05/2014   Patient unsure if hyper or hypo    Past Surgical History:  Procedure Laterality Date  . BREAST BIOPSY     benign  . COLONOSCOPY  approx 2005   patient not sure of findings or where procedure done  . PARTIAL HYSTERECTOMY      Family History  Problem Relation Age of Onset  . Colon cancer Father        late 57s  . Liver disease Father        cirrhosis, etoh   Social History:  reports that she has never smoked. She has never used smokeless tobacco. She reports that she does not drink alcohol or use drugs.  Allergies:  Allergies  Allergen Reactions  . Trazodone And Nefazodone Palpitations    Medications Prior to Admission  Medication Sig Dispense Refill  . amLODipine (NORVASC) 5 MG tablet Take 5 mg by mouth daily.    Marland Kitchen aspirin EC 81 MG tablet Take 81 mg by mouth daily.    Marland Kitchen atorvastatin (LIPITOR) 40 MG tablet Take 40 mg by mouth daily.     . Cholecalciferol (VITAMIN D PO) Take 1 tablet by mouth daily.    Marland Kitchen donepezil (ARICEPT) 10 MG tablet Take 1 tablet (10 mg total) by mouth at bedtime. 30 tablet 6  . L-LYSINE PO Take 1 capsule  by mouth daily.    Marland Kitchen levothyroxine (SYNTHROID, LEVOTHROID) 25 MCG tablet Take 25 mcg by mouth daily before breakfast.    . memantine (NAMENDA) 10 MG tablet TAKE (1) TABLET TWICE DAILY. (Patient taking differently: Take 10 mg by mouth 2 (two) times daily. ) 60 tablet 6  . Multiple Vitamins-Minerals (THERA-M) TABS Take 1 tablet by mouth daily.     . Omega-3 Fatty Acids (FISH OIL) 1000 MG CAPS Take 1,000 mg by mouth daily.     . vitamin C (ASCORBIC ACID) 500 MG tablet Take 500 mg by mouth daily.    Marland Kitchen glipiZIDE (GLUCOTROL XL) 2.5 MG 24 hr tablet Take 2.5 mg by mouth daily with breakfast.      No results found for this or any previous visit (from the past 48 hour(s)). No results found.  ROS  Blood pressure 120/76, pulse 67, temperature 98.3 F (36.8 C), temperature source Oral, resp. rate 14, height 5\' 6"  (1.676 m), weight 72.1 kg, SpO2 100 %. Physical Exam  Constitutional: She appears well-developed and well-nourished.  HENT:  Mouth/Throat: Oropharynx is clear and moist.  Eyes: Conjunctivae are normal. No scleral icterus.  Neck: No thyromegaly present.  Cardiovascular: Normal rate, regular rhythm and normal heart  sounds.  No murmur heard. Respiratory: Effort normal and breath sounds normal.  GI: She exhibits no distension and no mass. There is no tenderness.  Musculoskeletal: She exhibits no edema.  Lymphadenopathy:    She has no cervical adenopathy.  Neurological: She is alert.  Skin: Skin is warm and dry.     Assessment/Plan Screening colonoscopy. Emily history of CRC in mother at late onset.  Lionel December, MD 10/11/2018, 9:06 AM

## 2018-10-11 NOTE — Op Note (Signed)
[facility] Patient Name: Nicole Ward Procedure Date: 10/11/2018 8:43 AM MRN: [id number] Date of Birth: 08-29-1956 Attending MD: Lionel December , MD CSN: [id number] Age: 62 Admit Type: Outpatient Procedure:                Colonoscopy Indications:              Screening for colorectal malignant neoplasm,                            Screening in patient at increased risk: Colorectal                            cancer in mother 59 or older Providers:                Lionel December, MD, Nena Polio, RN, Burke Keels,                            Technician Referring MD:             Aris Georgia, NP Medicines:                Meperidine 50 mg IV, Midazolam 4 mg IV Complications:            No immediate complications. Estimated Blood Loss:     Estimated blood loss: none. Procedure:                Pre-Anesthesia Assessment:                           - Prior to the procedure, a History and Physical                            was performed, and patient medications and                            allergies were reviewed. The patient's tolerance of                            previous anesthesia was also reviewed. The risks                            and benefits of the procedure and the sedation                            options and risks were discussed with the patient.                            All questions were answered, and informed consent                            was obtained. Prior Anticoagulants: The patient                            last took aspirin 2 days prior to the procedure.  ASA Grade Assessment: II - A patient with mild                            systemic disease. After reviewing the risks and                            benefits, the patient was deemed in satisfactory                            condition to undergo the procedure.                           After obtaining informed consent, the colonoscope                            was passed  under direct vision. Throughout the                            procedure, the patient's blood pressure, pulse, and                            oxygen saturations were monitored continuously. The                            [id number]-[id number] ([id number]) scope was introduced through                            the anus and advanced to the the terminal ileum,                            with identification of the appendiceal orifice and                            IC valve. The colonoscopy was performed without                            difficulty. The patient tolerated the procedure                            well. The quality of the bowel preparation was                            good. The terminal ileum, ileocecal valve,                            appendiceal orifice, and rectum were photographed. Scope In: 9:17:03 AM Scope Out: 9:35:51 AM Scope Withdrawal Time: 0 hours 9 minutes 20 seconds  Total Procedure Duration: 0 hours 18 minutes 48 seconds  Findings:      The perianal and digital rectal examinations were normal.      A few medium-mouthed diverticula were found in the sigmoid colon,       hepatic flexure and ascending colon.      The exam was otherwise normal throughout the examined colon.  The retroflexed view of the distal rectum and anal verge was normal and       showed no anal or rectal abnormalities. Impression:               - Diverticulosis in the sigmoid colon, at the                            hepatic flexure and in the ascending colon.                           - No specimens collected. Moderate Sedation:      Moderate (conscious) sedation was administered by the endoscopy nurse       and supervised by the endoscopist. The following parameters were       monitored: oxygen saturation, heart rate, blood pressure, CO2       capnography and response to care. Total physician intraservice time was       24 minutes. Recommendation:           - Patient has a contact number available  for                            emergencies. The signs and symptoms of potential                            delayed complications were discussed with the                            patient. Return to normal activities tomorrow.                            Written discharge instructions were provided to the                            patient.                           - High fiber diet and diabetic (ADA) diet today.                           - Continue present medications.                           - Resume aspirin at prior dose today.                           - Repeat colonoscopy in 10 years for screening                            purposes. Procedure Code(s):        --- Professional ---                           [phone], Colonoscopy, flexible; diagnostic, including                            collection of specimen(s) by brushing or washing,  when performed (separate procedure)                           [id number], Moderate sedation; each additional 15                            minutes intraservice time                           G0500, Moderate sedation services provided by the                            same physician or other qualified health care                            professional performing a gastrointestinal                            endoscopic service that sedation supports,                            requiring the presence of an independent trained                            observer to assist in the monitoring of the                            patient's level of consciousness and physiological                            status; initial 15 minutes of intra-service time;                            patient age 74 years or older (additional time may                            be reported with 32440, as appropriate) Diagnosis Code(s):        --- Professional ---                           Z12.11, Encounter for screening for malignant                             neoplasm of colon                           Z80.0, Family history of malignant neoplasm of                            digestive organs                           K57.30, Diverticulosis of large intestine without                            perforation or abscess  without bleeding CPT copyright 2018 [employer]. All rights reserved. The codes documented in this report are preliminary and upon coder review may  be revised to meet current compliance requirements. Lionel December, MD Lionel December, MD 10/11/2018 9:45:03 AM This report has been signed electronically. Number of Addenda: 0

## 2018-10-13 LAB — GLUCOSE, CAPILLARY: Glucose-Capillary: 81 mg/dL (ref 70–99)

## 2018-10-18 ENCOUNTER — Encounter (HOSPITAL_COMMUNITY): Payer: Self-pay | Admitting: Internal Medicine

## 2018-11-01 ENCOUNTER — Other Ambulatory Visit: Payer: Self-pay | Admitting: Nurse Practitioner

## 2018-11-01 DIAGNOSIS — R413 Other amnesia: Secondary | ICD-10-CM

## 2018-11-13 ENCOUNTER — Other Ambulatory Visit: Payer: Self-pay | Admitting: Nurse Practitioner

## 2018-11-13 DIAGNOSIS — R413 Other amnesia: Secondary | ICD-10-CM

## 2018-12-02 ENCOUNTER — Other Ambulatory Visit: Payer: Self-pay | Admitting: Nurse Practitioner

## 2018-12-02 DIAGNOSIS — R413 Other amnesia: Secondary | ICD-10-CM

## 2019-02-05 ENCOUNTER — Encounter: Payer: Self-pay | Admitting: Neurology

## 2019-03-21 ENCOUNTER — Ambulatory Visit: Payer: Medicare HMO | Admitting: Neurology

## 2019-06-12 ENCOUNTER — Other Ambulatory Visit: Payer: Self-pay

## 2019-06-12 ENCOUNTER — Ambulatory Visit: Payer: Medicare HMO | Admitting: Neurology

## 2019-06-12 ENCOUNTER — Encounter: Payer: Self-pay | Admitting: Neurology

## 2019-06-12 VITALS — BP 140/78 | HR 80 | Ht 64.0 in | Wt 157.0 lb

## 2019-06-12 DIAGNOSIS — G309 Alzheimer's disease, unspecified: Secondary | ICD-10-CM

## 2019-06-12 DIAGNOSIS — R413 Other amnesia: Secondary | ICD-10-CM

## 2019-06-12 DIAGNOSIS — F0280 Dementia in other diseases classified elsewhere without behavioral disturbance: Secondary | ICD-10-CM | POA: Diagnosis not present

## 2019-06-12 MED ORDER — MEMANTINE HCL 10 MG PO TABS: 10.0000 mg | ORAL_TABLET | Freq: Two times a day (BID) | ORAL | 1 refills | Status: DC

## 2019-06-12 MED ORDER — DONEPEZIL HCL 10 MG PO TABS: 10.0000 mg | ORAL_TABLET | Freq: Every day | ORAL | 3 refills | Status: DC

## 2019-06-12 NOTE — Patient Instructions (Signed)
Please Continue with your healthy lifestyle, good nutrition, good hydration, try to drink 6 to 8 cups of water per day if possible.  Please monitor your driving skills.  We will continue with donepezil 10 mg each evening and memantine, 10 mg twice daily.  I renewed your prescriptions.  Please follow-up routinely in 6 months to see Ward Givens, nurse practitioner.

## 2019-06-12 NOTE — Progress Notes (Signed)
Subjective:    Patient ID: Nicole Ward is a 63 y.o. female.  HPI     Interim history:   Nicole Ward is a 63 year old right-handed woman with an underlying medical history of hypothyroidism, hypertension, recent diagnosis of type 2 diabetes, hyperlipidemia, and depression, who presents for follow-up consultation of her memory loss. She is accompanied by her daughter today. I last saw her on 09/08/2017, at which time she reported no new symptoms.  She had recently lost a nephew.  She had neuropsych testing with Dr. Vikki Ports in April 2018 and we talked about his test results and recommendations at the time.  His final diagnosis was mild neurocognitive disorder rule out Alzheimer's versus pseudodementia, unspecified depressive disorder.   She saw Cecille Rubin on 03/09/2018, at which time her MMSE was 21.  She was advised to continue with Aricept and Namenda.  She saw Cecille Rubin, nurse practitioner on 09/19/2018, at which time her MMSE was 25 out of 30.  She was advised to continue with Aricept and Namenda.  Today, 06/12/2019: She reports feeling stable, she does drive locally, typically to her mom's house which is within 15 minutes drive and her sister's house which is within 5 minutes of her house.  Her daughter reports that there are no new symptoms but she has seen some in her mother's short-term memory.  The patient lives with her daughter and daughters 83 year old daughter.  She reports that her primary care nurse practitioner recently refilled the Namenda for her and only prescribed 1 pill in the evening, she has been taking that for the past month but would like to go back to taking it twice daily as before.  She denies any depression or anxiety at this time.  She may not always hydrate well with water, estimates that she drinks about 3 cups of water per day and some juice in between.  She does not drink coffee.  The patient's allergies, current medications, family history, past medical  history, past social history, past surgical history and problem list were reviewed and updated as appropriate.    Previously (copied from previous notes for reference):    I saw her on 07/13/2016, at which time she was on dual therapy, had some depressive symptoms off and on was tolerating her medications. PCP had started the memantine. MMSE was 26/30 at the time.    She saw Cecille Rubin in the interim on 03/07/2017 at which time her MMSE was 25/30. She was advised to continue her dual medication regimen.     I saw her on 02/03/2015, at which time she reported an increase in her lovastatin to 40 mg. Her sister felt that her short-term memory was worse. She had a new PCP. I suggested we proceed with a carotid Doppler ultrasound and brain MRI. She had a ultrasound carotid bilaterally on 02/13/2015 which showed no significant stenosis involving the extracranial carotid arteries bilaterally.    She had a brain MRI without contrast on 06/25/2015 which I reviewed: IMPRESSION:  Abnormal MRI brain (without) demonstrating: 1. Mild perisylvian atrophy. 2. Mild chronic small vessel ischemic disease. 3. No acute findings. 4. No significant change from MRI on 05/31/12.   She was only on monotherapy with donepezil at the time.   In the interim, she was seen by Cecille Rubin, nurse practitioner on 12/30/2015, at which time she was advised to continue with generic Namenda twice daily and generic Aricept once daily. Her MMSE was 27 out of 30 at the time.  I first met her on 08/05/2014 at the request of her primary care provider, at which time she reported a history of memory loss for the past 2 years. She was on Aricept 10 mg for about 2 years. Her MMSE was 26 out of 30 at the time, clock drawing was 4 out of 4, animal fluency was 11.  I ordered a repeat brain MRI and blood work. I did not suggest any new medications. Blood work from 08/05/2014 showed a normal RPR, normal B12, normal TSH, vitamin D was  low at 10.4 and hemoglobin A1c was elevated at 6.4. We called her with her test results and advised her to talk to her primary care physician regarding her low vitamin D but she was advised to start an over-the-counter vitamin D supplement at 2000 units daily.    She had repeat neuropsychological testing on 10/08/2014 and compared to her test results from 07/06/2013 her findings were stable. 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   She resides with her sister. She is reportedly still driving but does not feel comfortable going far by herself and has gotten lost. She had a previous neurological workup in the form of the EEG in the past which was negative according to your note. CT head in the past showed white matter changes. She has no personal history of epilepsy or stroke or migraine headaches. She's had some recent recurrent headaches with relief with over-the-counter Advil or Aleve. She has seen Dr. Sima Matas, psychologist in the past and I reviewed his records from June 2014. His impression was memory difficulties, executive function deficits angiographic disorientation. He felt that her memory difficulties were beyond those typically seen with depression and stress and he suggested neuro cognitive testing. The patient had her neuropsychological evaluation on 06/05/2013. I reviewed the test results. The impression was that findings were consistent with those found with early-onset dementia of the Alzheimer's type. While the patient does have a generally complicated medical picture with abnormalities shown on the MRI brain that may be related to vascular changes, his impression was that of early-onset Alzheimer's disease. He recommended re-testing in 6-9 months, but this was not done.  She had a brain MRI without contrast on 05/31/2012: No acute intracranial abnormality. Multiple small subcortical white matter  hyperintensities are present. These may be due to chronic microvascular ischemia, migraine headaches, vasculitis also possible. In addition, I personally reviewed the images through the PACS system. Prior to that she had a head CT without contrast on 09/15/2011: Showed no acute abnormality. In addition, I personally reviewed the images through the PACS system. Her vascular risk factors include hyperlipidemia, diabetes, hypertension, overweight state with a BMI of 28.3. She reports a lot of stress with respect to her husband's addiction and she got divorced, but re-married her ex-husband, and lost her job, income, her home and has been divorced for 2 years.   She used to see Dr. Merlene Laughter, but d/t insurance issues, she could not go back to him.   Her MGM had some memory loss and mother is 65 yo and has had some memory loss, but both had memory loss in their older age.   She has had some weight loss. She has anxiety and fear, but no paranoia. She worked third shift at [employer] for about 8 years and was laid off in 11/13. She now lives with her daughter who work 12 h shift and her 58 yo GD,  who is in school and sometimes she feels fearful of staying at home. She is depressed, but is not on any medication. She was on Remeron, but gained.  Symptom onset was gradual and are now fairly stable. She primarily has been having difficulty with short-term memory such as forgetfulness, misplacing things, asking the same question again and forgetting dates and events. There is some report of confusion or disorientation. Familiar faces are easily recognized. She drives within 5 m radius, not at night and no interstate.   She reports some headaches.   She has a history of smoking marijuana. She is a non-smoker. She does not drink alcohol. she denies suicidal ideations or homicidal ideations.    The patient denies prior TIA or stroke symptoms, such as sudden onset of one sided weakness, numbness, tingling, slurring of speech or  droopy face, hearing loss, tinnitus, diplopia or visual field cut or monocular loss of vision.  Of note, the patient denies snoring, and there is no report of witnessed apneas.   Her Past Medical History Is Significant For: Past Medical History:  Diagnosis Date  . Alzheimer's disease with early onset ([place])   . Diabetes mellitus without complication ([place])   . Hyperlipidemia    off meds for years  . Hypertension   . Memory loss   . Thyroid disease 05/2014   Patient unsure if hyper or hypo    Her Past Surgical History Is Significant For: Past Surgical History:  Procedure Laterality Date  . BREAST BIOPSY     benign  . COLONOSCOPY  approx 2005   patient not sure of findings or where procedure done  . COLONOSCOPY N/A 10/11/2018   Procedure: COLONOSCOPY;  Surgeon: Rogene Houston, MD;  Location: [facility];  Service: Endoscopy;  Laterality: N/A;  930  . PARTIAL HYSTERECTOMY      Her Family History Is Significant For: Family History  Problem Relation Age of Onset  . Colon cancer Father        late 67s  . Liver disease Father        cirrhosis, etoh    Her Social History Is Significant For: Social History   Socioeconomic History  . Marital status: Divorced    Spouse name: Not on file  . Number of children: 1  . Years of education: 34  . Highest education level: Not on file  Occupational History    Employer: [employer]  . Financial resource strain: Not on file  . Food insecurity    Worry: Not on file    Inability: Not on file  . Transportation needs    Medical: Not on file    Non-medical: Not on file  Tobacco Use  . Smoking status: Never Smoker  . Smokeless tobacco: Never Used  Substance and Sexual Activity  . Alcohol use: No    Alcohol/week: 0.0 standard drinks  . Drug use: No  . Sexual activity: Not on file  Lifestyle  . Physical activity    Days per week: Not on file    Minutes per session: Not on file  . Stress: Not on file  Relationships  .  Social Herbalist on phone: Not on file    Gets together: Not on file    Attends religious service: Not on file    Active member of club or organization: Not on file    Attends meetings of clubs or organizations: Not on file    Relationship status: Not  on file  Other Topics Concern  . Not on file  Social History Narrative   Patient states she remarried her husband in 67. States he is in jail for cocaine use. Under a lot of stress.    Her Allergies Are:  Allergies  Allergen Reactions  . Trazodone And Nefazodone Palpitations  :   Her Current Medications Are:  Outpatient Encounter Medications as of 06/12/2019  Medication Sig  . amLODipine (NORVASC) 5 MG tablet Take 5 mg by mouth daily.  Marland Kitchen aspirin EC 81 MG tablet Take 81 mg by mouth daily.  . Cholecalciferol (VITAMIN D PO) Take 1 tablet by mouth daily.  Marland Kitchen donepezil (ARICEPT) 10 MG tablet Take 1 tablet (10 mg total) by mouth at bedtime.  Marland Kitchen glipiZIDE (GLUCOTROL XL) 2.5 MG 24 hr tablet Take 2.5 mg by mouth daily with breakfast.  . L-LYSINE PO Take 1 capsule by mouth daily.  Marland Kitchen levothyroxine (SYNTHROID, LEVOTHROID) 25 MCG tablet Take 25 mcg by mouth daily before breakfast.  . memantine (NAMENDA) 10 MG tablet Take 1 tablet (10 mg total) by mouth 2 (two) times daily.  . Multiple Vitamins-Minerals (THERA-M) TABS Take 1 tablet by mouth daily.   . Omega-3 Fatty Acids (FISH OIL) 1000 MG CAPS Take 1,000 mg by mouth daily.   . vitamin C (ASCORBIC ACID) 500 MG tablet Take 500 mg by mouth daily.  . [DISCONTINUED] donepezil (ARICEPT) 10 MG tablet TAKE 1 TABLET BY MOUTH AT BEDTIME.  . [DISCONTINUED] memantine (NAMENDA) 10 MG tablet TAKE (1) TABLET TWICE DAILY.  Marland Kitchen atorvastatin (LIPITOR) 40 MG tablet Take 40 mg by mouth daily.    No facility-administered encounter medications on file as of 06/12/2019.   :  Review of Systems:  Out of a complete 14 point review of systems, all are reviewed and negative with the exception of these symptoms as  listed below: Review of Systems  Neurological:       Pt presents today to follow up on her memory. She has been taking namenda QHS instead of BID, last refilled by her PCP. She believes this is a mistake.    Objective:  Neurological Exam  Physical Exam Physical Examination:   Vitals:   06/12/19 1346  BP: 140/78  Pulse: 80    General Examination: The patient is a very pleasant 63 y.o. female in no acute distress. She appears well-developed and well-nourished and well groomed. Good spirits.  HEENT:Normocephalic, atraumatic, pupils are equal, round and reactive to light and accommodation. Extraocular tracking shows no saccadic breakdown without nystagmus noted. Hearing is intact. Face is symmetric with no facial masking and normal facial sensation. There is no lip, neck or jaw tremor. Neck is not rigid with intact passive ROM. Oropharynx exam reveals mild mouth dryness. No significant airway crowding is noted. Mallampati is class II. Tongue protrudes centrally and palate elevates symmetrically.   Chest:is clear to auscultation without wheezing, rhonchi or crackles noted.  Heart:sounds are regular and normal without murmurs, rubs or gallops noted.   Abdomen:is soft, non-tender and non-distended.  Extremities:There is no pitting edema in the distal lower extremities bilaterally.   Skin: is warm and dry with no trophic changes noted.   Musculoskeletal: exam reveals no obvious joint deformities, tenderness or joint swelling or erythema.   Neurologically:  Mental status: The patient is awake and alert, paying good attention. She is able to provide the history. Her daughter supplements. Mood and affect are good.   On 08/05/14: MMSE (Mini-Mental state exam) score is 26/30.  CDT (Clock Drawing Test) score is 4/4. AFT (Animal Fluency Test) score is 11. Her geriatric depression score is 8.  On 02/03/2015: MMSE: 25/30, CDT: 4/4, AFT: 21/min, GDS: 7/15.  On 07/13/2016: MMSE:  26/30, CDT: 4/4, AFT: 28/min.   On 03/07/17: MMSE: 25/30.  On 09/08/2017: MMSE: 20/30, CDT: 4/4, AFT: 16/min.   On 06/12/2019: MMSE: 20/30, CDT: 4/4, AFT: 14/min.  Cranial nerves are as described above under HEENT exam.   Motor exam: Normal bulk, and strength for age is noted. Tone is not rigid with absence of cogwheeling in the extremities. There is overall no bradykinesia. There is no drift or rebound. There is no tremor. Romberg is negative. Reflexes are 2+ in the upper extremities and 2+ in the lower extremities. Fine motor skills: grossly intact.   Cerebellar testing shows no dysmetria or intention tremor. There is no truncal or gait ataxia.   Sensory exam is intact to light touch in the upper and lower extremities.   Gait, station and balance: She stands up from the seated position with no significant difficulty. No veering to one side is noted. No leaning to one side. Posture is Age-appropriate. Stance is narrow-based. She turns in 2 steps. Tandem walk is good today, balance is not impaired.   Assessment and Plan:   In summary, Nicole Ward is a very pleasant 63 year old female with an underlying medical history of hypothyroidism, hypertension, recent diagnosis of type 2 diabetes, hyperlipidemia, and depression, who presents for follow up consultation of her memory loss of about 4 to 5 years' duration. She has has been on dual memory medication, most recent neuropsychological evaluation from March 2018 was Indicative of mild dementia, rule out Alzheimer's.  Her mood is better.  She is trying to pursue a healthy Lifestyle, she lives with her daughter.  She has limited her driving. She is cautioned about her driving and they are both advised to monitor it.  She is advised to increase her water intake. We mutually agreed to continue with her dual medication. She has been able to tolerate generic Aricept and generic Namenda, which I will change back to 10 mg bid. Her neuropsychological  evaluation from July 2014 suggested early-onset Alzheimer's disease but repeat testing in 11/15 suggested findings in keeping with vascular dementia. She had vascular changes on her brain MRI in the past. I ordered another scan, but the insurance denied it at the time. Her physical exam remains nonfocal. She does not currently feel depressed. I renewed her prescriptions for donepezil and memantine. We talked about maintaining a healthy lifestyle in general and staying active mentally and physically. I suggested a 6 month follow-up, she can see one of our nurse practitioners at the time. I answered all their questions today and the patient and her daughter were in agreement. I spent 15 minutes in total face-to-face time with the patient, more than 50% of which was spent in counseling and coordination of care, reviewing test results, reviewing medication and discussing or reviewing the diagnosis of dementia, its prognosis and treatment options. Pertinent laboratory and imaging test results that were available during this visit with the patient were reviewed by me and considered in my medical decision making (see chart for details).

## 2019-07-01 ENCOUNTER — Emergency Department (HOSPITAL_COMMUNITY)
Admission: EM | Admit: 2019-07-01 | Discharge: 2019-07-01 | Disposition: A | Discharge status: Home / Self Care | Payer: Medicare HMO | Attending: Emergency Medicine | Admitting: Emergency Medicine

## 2019-07-01 ENCOUNTER — Encounter (HOSPITAL_COMMUNITY): Payer: Self-pay

## 2019-07-01 ENCOUNTER — Other Ambulatory Visit: Payer: Self-pay

## 2019-07-01 ENCOUNTER — Emergency Department (HOSPITAL_COMMUNITY): Payer: Medicare HMO

## 2019-07-01 DIAGNOSIS — Z7984 Long term (current) use of oral hypoglycemic drugs: Secondary | ICD-10-CM | POA: Insufficient documentation

## 2019-07-01 DIAGNOSIS — F0280 Dementia in other diseases classified elsewhere without behavioral disturbance: Secondary | ICD-10-CM | POA: Insufficient documentation

## 2019-07-01 DIAGNOSIS — Z79899 Other long term (current) drug therapy: Secondary | ICD-10-CM | POA: Insufficient documentation

## 2019-07-01 DIAGNOSIS — Z7982 Long term (current) use of aspirin: Secondary | ICD-10-CM | POA: Diagnosis not present

## 2019-07-01 DIAGNOSIS — E1165 Type 2 diabetes mellitus with hyperglycemia: Secondary | ICD-10-CM | POA: Insufficient documentation

## 2019-07-01 DIAGNOSIS — I10 Essential (primary) hypertension: Secondary | ICD-10-CM | POA: Diagnosis not present

## 2019-07-01 DIAGNOSIS — R079 Chest pain, unspecified: Secondary | ICD-10-CM

## 2019-07-01 DIAGNOSIS — E039 Hypothyroidism, unspecified: Secondary | ICD-10-CM | POA: Insufficient documentation

## 2019-07-01 DIAGNOSIS — G309 Alzheimer's disease, unspecified: Secondary | ICD-10-CM | POA: Insufficient documentation

## 2019-07-01 DIAGNOSIS — R739 Hyperglycemia, unspecified: Secondary | ICD-10-CM

## 2019-07-01 LAB — BASIC METABOLIC PANEL
Anion gap: 9 (ref 5–15)
BUN: 13 mg/dL (ref 8–23)
CO2: 26 mmol/L (ref 22–32)
Calcium: 9.1 mg/dL (ref 8.9–10.3)
Chloride: 102 mmol/L (ref 98–111)
Creatinine, Ser: 0.97 mg/dL (ref 0.44–1.00)
GFR calc Af Amer: >60 mL/min (ref >60)
GFR calc non Af Amer: >60 mL/min (ref >60)
Glucose, Bld: 202 mg/dL — ABNORMAL HIGH (ref 70–99)
Potassium: 4 mmol/L (ref 3.5–5.1)
Sodium: 137 mmol/L (ref 135–145)

## 2019-07-01 LAB — CBC WITH DIFFERENTIAL/PLATELET
Abs Immature Granulocytes: 0.01 10*3/uL (ref 0.00–0.07)
Basophils Absolute: 0 10*3/uL (ref 0.0–0.1)
Basophils Relative: 0 %
Eosinophils Absolute: 0.1 10*3/uL (ref 0.0–0.5)
Eosinophils Relative: 1 %
HCT: 40.8 % (ref 36.0–46.0)
Hemoglobin: 13.4 g/dL (ref 12.0–15.0)
Immature Granulocytes: 0 %
Lymphocytes Relative: 17 %
Lymphs Abs: 1 10*3/uL (ref 0.7–4.0)
MCH: 28 pg (ref 26.0–34.0)
MCHC: 32.8 g/dL (ref 30.0–36.0)
MCV: 85.2 fL (ref 80.0–100.0)
Monocytes Absolute: 0.5 10*3/uL (ref 0.1–1.0)
Monocytes Relative: 8 %
Neutro Abs: 4.3 10*3/uL (ref 1.7–7.7)
Neutrophils Relative %: 74 %
Platelets: 251 10*3/uL (ref 150–400)
RBC: 4.79 MIL/uL (ref 3.87–5.11)
RDW: 13.3 % (ref 11.5–15.5)
WBC: 5.8 10*3/uL (ref 4.0–10.5)
nRBC: 0 % (ref 0.0–0.2)

## 2019-07-01 LAB — TROPONIN I (HIGH SENSITIVITY)
Troponin I (High Sensitivity): 2 ng/L (ref <18)
Troponin I (High Sensitivity): 3 ng/L (ref <18)
Troponin I (High Sensitivity): 3 ng/L (ref <18)

## 2019-07-01 MED ORDER — MORPHINE SULFATE (PF) 2 MG/ML IV SOLN
2.0000 mg | Freq: Once | INTRAVENOUS | Status: AC
  Administered 2019-07-01: 2 mg via INTRAVENOUS
  Filled 2019-07-01: qty 1

## 2019-07-01 MED ORDER — ONDANSETRON HCL 4 MG/2ML IJ SOLN
4.0000 mg | Freq: Once | INTRAMUSCULAR | Status: AC
  Administered 2019-07-01: 4 mg via INTRAVENOUS
  Filled 2019-07-01: qty 2

## 2019-07-01 NOTE — Discharge Instructions (Signed)
Please read and follow all provided instructions.  Your diagnoses today include:  1. Right-sided chest pain   2. Hyperglycemia     Tests performed today include: An EKG of your heart A chest x-ray Cardiac enzymes - a blood test for heart muscle damage Blood counts and electrolytes Vital signs. See below for your results today.   Medications prescribed:   Take any prescribed medications only as directed.  Continue all home medications.  Follow-up instructions: Please follow-up with your primary care provider as soon as you can for further evaluation of your symptoms.   I have placed a referral to cardiology.  They should be giving you a call within the next week to make an appointment.  Return instructions:  SEEK IMMEDIATE MEDICAL ATTENTION IF: You have severe chest pain, especially if the pain is crushing or pressure-like and spreads to the arms, back, neck, or jaw, or if you have sweating, nausea (feeling sick to your stomach), or shortness of breath. THIS IS AN EMERGENCY. Don't wait to see if the pain will go away. Get medical help at once. Call 911 or [phone] (operator). DO NOT drive yourself to the hospital.  Your chest pain gets worse and does not go away with rest.  You have an attack of chest pain lasting longer than usual, despite rest and treatment with the medications your caregiver has prescribed.  You wake from sleep with chest pain or shortness of breath. You feel dizzy or faint. You have chest pain not typical of your usual pain for which you originally saw your caregiver.  You have any other emergent concerns regarding your health.  Additional Information: Chest pain comes from many different causes. Your caregiver has diagnosed you as having chest pain that is not specific for one problem, but does not require admission.  You are at low risk for an acute heart condition or other serious illness.   Your vital signs today were: BP 100/64    Pulse 60    Temp 98.2 F  (36.8 C) (Oral)    Resp 14    Ht 5\' 6"  (1.676 m)    Wt 64.4 kg    SpO2 99%    BMI 22.92 kg/m  If your blood pressure (BP) was elevated above 135/85 this visit, please have this repeated by your doctor within one month. --------------

## 2019-07-01 NOTE — ED Provider Notes (Addendum)
[facility] EMERGENCY DEPARTMENT Provider Note   CSN: [id number] Arrival date & time: 07/01/19  1236     History   Chief Complaint Chief Complaint  Patient presents with  . Chest Pain    HPI Nicole Ward is a 63 y.o. female.     HPI  Patient is a 63 year old female with past medical history of Alzheimer's disease, early onset, to diabetes mellitus, hyperlipidemia, hypertension presenting for chest pain.  She reports that this began suddenly within the hour prior to arrival when she was at church.  She reports that she stood up to go staying and felt a sudden onset sharp pain in the anterior right side of her chest underneath her left breast.  She denied pleuritic pain.  She reports that the pain made her short of breath, have palpitations and feel lightheaded.  She reports that she had to sit down.  EMS was called to the church.  Patient reports that she still has some discomfort particular with palpation under the left breast and flexing her torso.  She denies any current shortness of breath or pleuritic chest pain.  She denies any cardiac history.  She does not have a history of stress test that she is aware of.  No cardiac catheterization history.  Her father died of an MI but she is not sure at what age; occurred in [id number].  She takes ASA daily and received ASA from EMS.  Past Medical History:  Diagnosis Date  . Alzheimer's disease with early onset (HCC)   . Diabetes mellitus without complication (HCC)   . Hyperlipidemia    off meds for years  . Hypertension   . Memory loss   . Thyroid disease 05/2014   Patient unsure if hyper or hypo    Patient Active Problem List   Diagnosis Date Noted  . Family history of colon cancer 07/12/2018  . Special screening for malignant neoplasms, colon 07/12/2018  . Memory loss 03/09/2018  . Dyslipidemia 12/16/2015  . Adult hypothyroidism 09/15/2015  . Essential (primary) hypertension 05/15/2015  . Diabetes mellitus, type 2 (HCC)  05/15/2015  . Dementia, vascular (HCC) 05/15/2015  . Alzheimer's disease (HCC) 08/05/2014  . Clinical depression 04/11/2013  . Weight loss, abnormal 06/01/2011  . Lower abdominal pain 06/01/2011  . Constipation, chronic 06/01/2011  . FH: colon cancer 06/01/2011    Past Surgical History:  Procedure Laterality Date  . BREAST BIOPSY     benign  . COLONOSCOPY  approx 2005   patient not sure of findings or where procedure done  . COLONOSCOPY N/A 10/11/2018   Procedure: COLONOSCOPY;  Surgeon: Malissa Hippoehman, Najeeb U, MD;  Location: [facility];  Service: Endoscopy;  Laterality: N/A;  930  . PARTIAL HYSTERECTOMY       OB History   No obstetric history on file.      Home Medications    Prior to Admission medications   Medication Sig Start Date End Date Taking? Authorizing Provider  amLODipine (NORVASC) 5 MG tablet Take 5 mg by mouth daily. 05/15/18   [provider]  aspirin EC 81 MG tablet Take 81 mg by mouth daily.    [provider]  atorvastatin (LIPITOR) 40 MG tablet Take 40 mg by mouth daily.  05/22/15 10/05/18  [provider]  Cholecalciferol (VITAMIN D PO) Take 1 tablet by mouth daily.    [provider]  donepezil (ARICEPT) 10 MG tablet Take 1 tablet (10 mg total) by mouth at bedtime. 06/12/19  Huston FoleyAthar, Saima, MD  glipiZIDE (GLUCOTROL XL) 2.5 MG 24 hr tablet Take 2.5 mg by mouth daily with breakfast.    [provider]  L-LYSINE PO Take 1 capsule by mouth daily.    [provider]  levothyroxine (SYNTHROID, LEVOTHROID) 25 MCG tablet Take 25 mcg by mouth daily before breakfast.    [provider]  memantine (NAMENDA) 10 MG tablet Take 1 tablet (10 mg total) by mouth 2 (two) times daily. 06/12/19   Huston FoleyAthar, Saima, MD  Multiple Vitamins-Minerals (THERA-M) TABS Take 1 tablet by mouth daily.     [provider]  Omega-3 Fatty Acids (FISH OIL) 1000 MG CAPS Take 1,000 mg by mouth daily.     [provider]  vitamin  C (ASCORBIC ACID) 500 MG tablet Take 500 mg by mouth daily.    [provider]    Family History Family History  Problem Relation Age of Onset  . Colon cancer Father        late 5550s  . Liver disease Father        cirrhosis, etoh    Social History Social History   Tobacco Use  . Smoking status: Never Smoker  . Smokeless tobacco: Never Used  Substance Use Topics  . Alcohol use: No    Alcohol/week: 0.0 standard drinks  . Drug use: No     Allergies   Trazodone and nefazodone   Review of Systems Review of Systems  Constitutional: Negative for chills and fever.  HENT: Negative for congestion, rhinorrhea, sinus pain and sore throat.   Eyes: Negative for visual disturbance.  Respiratory: Positive for chest tightness. Negative for cough and shortness of breath.   Cardiovascular: Positive for chest pain.  Gastrointestinal: Negative for abdominal pain, nausea and vomiting.  Genitourinary: Negative for dysuria and flank pain.  Musculoskeletal: Negative for back pain and myalgias.  Skin: Negative for rash.  Neurological: Positive for light-headedness. Negative for dizziness, syncope and headaches.     Physical Exam Updated Vital Signs BP (!) 148/72 (BP Location: Right Arm)   Pulse 82   Temp 98.2 F (36.8 C) (Oral)   Resp 15   Ht 5\' 6"  (1.676 m)   Wt 64.4 kg   SpO2 99%   BMI 22.92 kg/m   Physical Exam Vitals signs and nursing note reviewed.  Constitutional:      General: She is not in acute distress.    Appearance: She is well-developed.  HENT:     Head: Normocephalic and atraumatic.  Eyes:     Conjunctiva/sclera: Conjunctivae normal.     Pupils: Pupils are equal, round, and reactive to light.  Neck:     Musculoskeletal: Normal range of motion and neck supple.  Cardiovascular:     Rate and Rhythm: Normal rate and regular rhythm.     Pulses:          Radial pulses are 2+ on the right side and 2+ on the left side.       Dorsalis pedis pulses are 2+ on  the right side and 2+ on the left side.     Heart sounds: S1 normal and S2 normal. No murmur.  Pulmonary:     Effort: Pulmonary effort is normal.     Breath sounds: Normal breath sounds. No wheezing or rales.  Abdominal:     General: There is no distension.     Palpations: Abdomen is soft.     Tenderness: There is no abdominal tenderness. There is no guarding.  Musculoskeletal: Normal range of motion.        General: No deformity.     Right lower leg: She exhibits no tenderness. No edema.     Left lower leg: She exhibits no tenderness. No edema.  Lymphadenopathy:     Cervical: No cervical adenopathy.  Skin:    General: Skin is warm and dry.     Findings: No erythema or rash.  Neurological:     Mental Status: She is alert.     Comments: Cranial nerves grossly intact. Patient moves extremities symmetrically and with good coordination.  Psychiatric:        Behavior: Behavior normal.        Thought Content: Thought content normal.        Judgment: Judgment normal.      ED Treatments / Results  Labs (all labs ordered are listed, but only abnormal results are displayed) Labs Reviewed  BASIC METABOLIC PANEL - Abnormal; Notable for the following components:      Result Value   Glucose, Bld 202 (*)    All other components within normal limits  CBC WITH DIFFERENTIAL/PLATELET  TROPONIN I (HIGH SENSITIVITY)  TROPONIN I (HIGH SENSITIVITY)  TROPONIN I (HIGH SENSITIVITY)    EKG EKG Interpretation  Date/Time:  Sunday July 01 2019 12:43:42 EDT Ventricular Rate:  80 PR Interval:    QRS Duration: 78 QT Interval:  379 QTC Calculation: 438 R Axis:   19 Text Interpretation:  Sinus rhythm Consider left atrial enlargement Low voltage, precordial leads Borderline T abnormalities, diffuse leads similar to prior 6/01 Confirmed by Meridee ScoreButler, Michael [id number]) on 07/01/2019 5:04:30 PM   Radiology Dg Chest 2 View  Result Date: 07/01/2019 CLINICAL DATA:  Acute onset of chest pain and  tachycardia today. EXAM: CHEST - 2 VIEW COMPARISON:  None. FINDINGS: The heart size and mediastinal contours are within normal limits. Both lungs are clear. The visualized skeletal structures are unremarkable. IMPRESSION: No active cardiopulmonary disease. Electronically Signed   By: Danae OrleansJohn A Stahl M.D.   On: 07/01/2019 14:27    Procedures Procedures (including critical care time)  Medications Ordered in ED Medications  morphine 2 MG/ML injection 2 mg (has no administration in time range)  ondansetron (ZOFRAN) injection 4 mg (has no administration in time range)     Initial Impression / Assessment and Plan / ED Course  I have reviewed the triage vital signs and the nursing notes.  Pertinent labs & imaging results that were available during my care of the patient were reviewed by me and considered in my medical decision making (see chart for details).  Clinical Course as of Jul 01 1743  Sun Jul 01, 2019  1446 Troponin I (High Sensitivity): 2 [AM]  1626 No elevation in delta troponin.   Troponin I (High Sensitivity): 3 [AM]  1744 BP cuff was ill-fitting. Recheck and pt had systolic of 124.   BP: 100/64 [AM]    Clinical Course User Index [AM] Elisha PonderMurray, Haldon Carley B, PA-C       Differential diagnosis includes ACS, PE, thoracic aortic dissection, Boerhaave's syndrome, cardiac tamponade, pneumothorax, incarcerated diaphragmatic hernia, cholecystitis, esophageal spasm, gastroesophageal reflux, herpes zoster of the thorax, pericarditis, pneumonia, chest wall pain, costochondritis.   Doubt ACS, as delta troponin is negative, initial EKGs show no signs of ischemia, infarction, or arrhythmia, reviewed by me, and HEART score 3 (age, risk factors). Doubt PE as Well's score 0 and PERC 1 (age only; story not consistent with PE), and patient not tachycardic. Doubt TAD  by hx, CXR showed no widening mediastinum, and pulses equal in all extremities. Patient remained nontoxic appearing and in no acute distress  during emergency department course. Vital signs stable in the emergency department. Therefore, doubt esophageal rupture, cardiac tamponade, or pneumothorax. Pericarditis less likely due to no preceding infectious symptoms and pain not improved in upright positions. Abnormal labs include hyperglycemia with no anion gap or other electrolyte disturbance.    Patient does have cardiovascular risk factors and family history in first-degree relative of MI, however it was likely over the age of 9 per the report of patient and her daughter although they do not know the exact age.  Cardiology is reasonable of the chest pain atypical and heart score 3.  She is stable for outpatient follow-up.  Referral placed.  Patient's work-up and follow-up was discussed with the patient's daughter over the phone.  She is in understanding and agrees with the plan of care.  This is a shared visit with Dr. Nat Christen. Patient was independently evaluated by this attending physician. Attending physician consulted in evaluation and discharge management.  Final Clinical Impressions(s) / ED Diagnoses   Final diagnoses:  Right-sided chest pain  Hyperglycemia    ED Discharge Orders         Ordered    Ambulatory referral to Cardiology     07/01/19 1739           Tamala Julian 07/01/19 1744    Albesa Seen, PA-C 07/01/19 1744    Nat Christen, MD 07/02/19 [phone]

## 2019-07-01 NOTE — ED Notes (Signed)
Per PA, will be discharging patient

## 2019-07-01 NOTE — ED Notes (Signed)
Pt left with daughter.

## 2019-07-01 NOTE — ED Triage Notes (Signed)
Pt stood up in church today and got severe central chest pain and started having tachycardia. Pt took 1 aspirin this morning and 3 more with EMS, therefore has taken 324. No cardiac history. CBG 228. Takes Glipizide

## 2019-08-14 ENCOUNTER — Ambulatory Visit: Payer: Medicare HMO | Admitting: Cardiovascular Disease

## 2019-09-10 ENCOUNTER — Other Ambulatory Visit (HOSPITAL_COMMUNITY): Payer: Self-pay | Admitting: Adult Health Nurse Practitioner

## 2019-09-10 DIAGNOSIS — Z1231 Encounter for screening mammogram for malignant neoplasm of breast: Secondary | ICD-10-CM

## 2019-09-17 ENCOUNTER — Ambulatory Visit: Payer: Medicare HMO | Admitting: Cardiovascular Disease

## 2019-09-19 ENCOUNTER — Ambulatory Visit: Payer: Medicare HMO | Admitting: Cardiovascular Disease

## 2019-09-19 ENCOUNTER — Other Ambulatory Visit: Payer: Self-pay

## 2019-09-19 ENCOUNTER — Encounter: Payer: Self-pay | Admitting: Cardiovascular Disease

## 2019-09-19 VITALS — BP 118/64 | HR 78 | Ht 65.0 in | Wt 153.0 lb

## 2019-09-19 DIAGNOSIS — R0600 Dyspnea, unspecified: Secondary | ICD-10-CM

## 2019-09-19 DIAGNOSIS — R079 Chest pain, unspecified: Secondary | ICD-10-CM

## 2019-09-19 DIAGNOSIS — Z23 Encounter for immunization: Secondary | ICD-10-CM

## 2019-09-19 DIAGNOSIS — E7800 Pure hypercholesterolemia, unspecified: Secondary | ICD-10-CM | POA: Diagnosis not present

## 2019-09-19 DIAGNOSIS — R0609 Other forms of dyspnea: Secondary | ICD-10-CM

## 2019-09-19 DIAGNOSIS — I10 Essential (primary) hypertension: Secondary | ICD-10-CM

## 2019-09-19 MED ORDER — METOPROLOL TARTRATE 100 MG PO TABS: 100.0000 mg | ORAL_TABLET | Freq: Once | ORAL | 0 refills | Status: DC

## 2019-09-19 NOTE — Progress Notes (Signed)
CARDIOLOGY CONSULT NOTE  Patient ID: Nicole Ward MRN: [id number] DOB/AGE: 05-24-1956 63 y.o.  Admit date: (Not on file) Primary Physician: Rebecka Apley, NP Referring Physician: Delia Chimes  Reason for Consultation: Chest pain  HPI: Nicole Ward is a 63 y.o. female who is being seen today for the evaluation of chest pain at the request of Aviva Kluver B, PA-C.   She was evaluated for chest pain and syncope in the ED on 07/01/2019.  Past medical history includes Alzheimer's disease, hyperlipidemia, and hypertension.  She reportedly was sitting at church and stood up and then experienced sharp left-sided chest pain accompanied by shortness of breath, palpitations, and lightheadedness.  Ultimately it was deemed atypical.  I reviewed all records, labs, and studies from ED assessment.  High-sensitivity troponins, CBC, and basic metabolic panel were normal.  Chest x-ray showed no active cardiopulmonary disease.  I personally reviewed the ECG performed on 07/01/2019 which demonstrated sinus rhythm with nonspecific T wave abnormalities.  Carotid Dopplers on 02/10/2017 showed bilateral ICA stenosis less than 50%.  Echocardiogram on 02/09/2017 demonstrated normal LV systolic function and regional wall motion, LVEF 55 to 60%.  Diastolic function was also normal.  There was mild mitral regurgitation.  Since the episode in the ED, she is not having exertional chest pain.  Over the past year she has had shortness of breath while vacuuming and has had to sit down and rest.  She did not notice the symptoms last year.  She denies palpitations, leg swelling, orthopnea, paroxysmal nocturnal dyspnea.  She lives with her daughter and granddaughter.  Family history: Father had an MI in his 72s.  Brother had CABG in his 85s.     Allergies  Allergen Reactions  . Trazodone And Nefazodone Palpitations     Current Outpatient Medications  Medication Sig Dispense  Refill  . amLODipine (NORVASC) 5 MG tablet Take 5 mg by mouth daily.    Marland Kitchen aspirin EC 81 MG tablet Take 81 mg by mouth daily.    Marland Kitchen atorvastatin (LIPITOR) 40 MG tablet Take 40 mg by mouth daily.     . Cholecalciferol (VITAMIN D PO) Take 1 tablet by mouth daily.    Marland Kitchen donepezil (ARICEPT) 10 MG tablet Take 1 tablet (10 mg total) by mouth at bedtime. 90 tablet 3  . glipiZIDE (GLUCOTROL XL) 2.5 MG 24 hr tablet Take 2.5 mg by mouth daily with breakfast.    . L-LYSINE PO Take 1 capsule by mouth daily.    Marland Kitchen levothyroxine (SYNTHROID, LEVOTHROID) 25 MCG tablet Take 25 mcg by mouth daily before breakfast.    . memantine (NAMENDA) 10 MG tablet Take 1 tablet (10 mg total) by mouth 2 (two) times daily. 180 tablet 1  . Multiple Vitamins-Minerals (THERA-M) TABS Take 1 tablet by mouth daily.     . Omega-3 Fatty Acids (FISH OIL) 1000 MG CAPS Take 1,000 mg by mouth daily.     . vitamin C (ASCORBIC ACID) 500 MG tablet Take 500 mg by mouth daily.     No current facility-administered medications for this visit.     Past Medical History:  Diagnosis Date  . Alzheimer's disease with early onset (HCC)   . Diabetes mellitus without complication (HCC)   . Hyperlipidemia    off meds for years  . Hypertension   . Memory loss   . Thyroid disease 05/2014   Patient unsure if hyper or hypo    Past Surgical History:  Procedure Laterality Date  . BREAST BIOPSY     benign  . COLONOSCOPY  approx 2005   patient not sure of findings or where procedure done  . COLONOSCOPY N/A 10/11/2018   Procedure: COLONOSCOPY;  Surgeon: Rogene Houston, MD;  Location: [facility];  Service: Endoscopy;  Laterality: N/A;  930  . PARTIAL HYSTERECTOMY      Social History   Socioeconomic History  . Marital status: Divorced    Spouse name: Not on file  . Number of children: 1  . Years of education: 59  . Highest education level: Not on file  Occupational History    Employer: [employer]  . Financial resource strain:  Not on file  . Food insecurity    Worry: Not on file    Inability: Not on file  . Transportation needs    Medical: Not on file    Non-medical: Not on file  Tobacco Use  . Smoking status: Never Smoker  . Smokeless tobacco: Never Used  Substance and Sexual Activity  . Alcohol use: No    Alcohol/week: 0.0 standard drinks  . Drug use: No  . Sexual activity: Not on file  Lifestyle  . Physical activity    Days per week: Not on file    Minutes per session: Not on file  . Stress: Not on file  Relationships  . Social Herbalist on phone: Not on file    Gets together: Not on file    Attends religious service: Not on file    Active member of club or organization: Not on file    Attends meetings of clubs or organizations: Not on file    Relationship status: Not on file  . Intimate partner violence    Fear of current or ex partner: Not on file    Emotionally abused: Not on file    Physically abused: Not on file    Forced sexual activity: Not on file  Other Topics Concern  . Not on file  Social History Narrative   Patient states she remarried her husband in 2. States he is in jail for cocaine use. Under a lot of stress.      Current Meds  Medication Sig  . amLODipine (NORVASC) 5 MG tablet Take 5 mg by mouth daily.  Marland Kitchen aspirin EC 81 MG tablet Take 81 mg by mouth daily.  Marland Kitchen atorvastatin (LIPITOR) 40 MG tablet Take 40 mg by mouth daily.   . Cholecalciferol (VITAMIN D PO) Take 1 tablet by mouth daily.  Marland Kitchen donepezil (ARICEPT) 10 MG tablet Take 1 tablet (10 mg total) by mouth at bedtime.  Marland Kitchen glipiZIDE (GLUCOTROL XL) 2.5 MG 24 hr tablet Take 2.5 mg by mouth daily with breakfast.  . L-LYSINE PO Take 1 capsule by mouth daily.  Marland Kitchen levothyroxine (SYNTHROID, LEVOTHROID) 25 MCG tablet Take 25 mcg by mouth daily before breakfast.  . memantine (NAMENDA) 10 MG tablet Take 1 tablet (10 mg total) by mouth 2 (two) times daily.  . Multiple Vitamins-Minerals (THERA-M) TABS Take 1 tablet by  mouth daily.   . Omega-3 Fatty Acids (FISH OIL) 1000 MG CAPS Take 1,000 mg by mouth daily.   . vitamin C (ASCORBIC ACID) 500 MG tablet Take 500 mg by mouth daily.      Review of systems complete and found to be negative unless listed above in HPI    Physical exam Blood pressure 118/64, pulse 78, height 5\' 5"  (1.651 m), weight  153 lb (69.4 kg), SpO2 95 %. General: NAD Neck: No JVD, no thyromegaly or thyroid nodule.  Lungs: Clear to auscultation bilaterally with normal respiratory effort. CV: Nondisplaced PMI. Regular rate and rhythm, normal S1/S2, no S3/S4, no murmur.  No peripheral edema.  No carotid bruit.    Abdomen: Soft, nontender, no distention.  Skin: Intact without lesions or rashes.  Neurologic: Alert and oriented x 3.  Psych: Normal affect. Extremities: No clubbing or cyanosis.  HEENT: Normal.   ECG: Most recent ECG reviewed.   Labs: Lab Results  Component Value Date/Time   K 4.0 07/01/2019 01:32 PM   BUN 13 07/01/2019 01:32 PM   BUN 13 08/05/2014 11:06 AM   CREATININE 0.97 07/01/2019 01:32 PM   CREATININE 0.90 05/21/2011 04:35 PM   ALT 18 08/05/2014 11:06 AM   TSH 1.640 08/05/2014 11:06 AM   HGB 13.4 07/01/2019 01:32 PM     Lipids: Lab Results  Component Value Date/Time   LDLCALC 231 05/21/2011 04:35 PM   CHOL 347 05/21/2011 04:35 PM        ASSESSMENT AND PLAN:  1.  Chest pain and shortness of breath: Unclear etiology but there is a family history of coronary artery disease.  I will proceed with coronary CT angiography.  2.  Hypertension: Controlled on present therapy.  No changes.  3.  Hyperlipidemia: Continue atorvastatin.    Disposition: Follow up in 3 months  Signed: Prentice DockerSuresh Miangel Flom, M.D., F.A.C.C.  09/19/2019, 1:39 PM

## 2019-09-19 NOTE — Patient Instructions (Addendum)
Medication Instructions:   Your physician recommends that you continue on your current medications as directed. Please refer to the Current Medication list given to you today.  Labwork:  Your physician recommends that you return for lab work in: within 6 weeks of your cardiac ct to check your BMET. This may be done at [facility] or [facility] in [place].  Testing/Procedures: Your physician has requested that you have cardiac CT. Cardiac computed tomography (CT) is a painless test that uses an x-ray machine to take clear, detailed pictures of your heart. For further information please visit [facility]. Please follow instruction sheet as given.  Follow-Up:  Your physician recommends that you schedule a follow-up appointment in: 3 months.  Any Other Special Instructions Will Be Listed Below (If Applicable).  If you need a refill on your cardiac medications before your next appointment, please call your pharmacy.  Your cardiac CT will be scheduled at one of the below locations:   [facility] [address] [place],  [address] [phone]   If scheduled at [facility], please arrive at the [facility] main entrance of [facility] 30-45 minutes prior to test start time. Proceed to the [facility] Radiology Department (first floor) to check-in and test prep.   Please follow these instructions carefully (unless otherwise directed):   On the Night Before the Test: . Be sure to Drink plenty of water. . Do not consume any caffeinated/decaffeinated beverages or chocolate 12 hours prior to your test. . Do not take any antihistamines 12 hours prior to your test.  On the Day of the Test: . Drink plenty of water. Do not drink any water within one hour of the test. . Do not eat any food 4 hours prior to the test. . You may take your regular medications prior to the test except glipizide. (HOLD morning of test) . Take metoprolol  (Lopressor) two hours prior to test. . HOLD Furosemide/Hydrochlorothiazide morning of the test. . FEMALES- please wear underwire-free bra if available       After the Test: . Drink plenty of water. . After receiving IV contrast, you may experience a mild flushed feeling. This is normal. . On occasion, you may experience a mild rash up to 24 hours after the test. This is not dangerous. If this occurs, you can take Benadryl 25 mg and increase your fluid intake. . If you experience trouble breathing, this can be serious. If it is severe call 911 IMMEDIATELY. If it is mild, please call our office. . If you take any of these medications: Glipizide/Metformin, Avandament, Glucavance, please do not take 48 hours after completing test unless otherwise instructed.    Please contact the cardiac imaging nurse navigator should you have any questions/concerns Marchia Bond, RN Navigator Cardiac Imaging [facility] [phone] Office

## 2019-10-12 LAB — BASIC METABOLIC PANEL
BUN: 10 mg/dL (ref 7–25)
CO2: 27 mmol/L (ref 20–32)
Calcium: 10 mg/dL (ref 8.6–10.4)
Chloride: 105 mmol/L (ref 98–110)
Creat: 0.81 mg/dL (ref 0.50–0.99)
Glucose, Bld: 134 mg/dL (ref 65–139)
Potassium: 4.4 mmol/L (ref 3.5–5.3)
Sodium: 141 mmol/L (ref 135–146)

## 2019-10-15 ENCOUNTER — Telehealth (HOSPITAL_COMMUNITY): Payer: Self-pay | Admitting: Emergency Medicine

## 2019-10-15 NOTE — Telephone Encounter (Signed)
Left message on voicemail with name and callback number Marchia Bond RN Navigator Cardiac Imaging [facility] [phone] Office [phone] Cell  With patients history of alzheimers/dementia, I called and left VM on daughters phone ([email or web address])

## 2019-10-16 ENCOUNTER — Telehealth: Payer: Self-pay | Admitting: *Deleted

## 2019-10-16 NOTE — Telephone Encounter (Signed)
Notes recorded by Laurine Blazer, LPN on 62/82/4175 at 8:24 AM EST  Patient notified via detailed voice message. Pre-CT lab. Copy to pmd.  ------   Notes recorded by Arnoldo Lenis, MD on 10/15/2019 at 3:25 PM EST  Normal labs    Zandra Abts MD

## 2019-10-17 ENCOUNTER — Other Ambulatory Visit: Payer: Self-pay

## 2019-10-17 ENCOUNTER — Ambulatory Visit (HOSPITAL_COMMUNITY)
Admission: RE | Admit: 2019-10-17 | Discharge: 2019-10-17 | Disposition: A | Discharge status: Home / Self Care | Payer: Medicare HMO | Source: Ambulatory Visit | Attending: Cardiovascular Disease | Admitting: Cardiovascular Disease

## 2019-10-17 DIAGNOSIS — R079 Chest pain, unspecified: Secondary | ICD-10-CM | POA: Insufficient documentation

## 2019-10-17 DIAGNOSIS — R0609 Other forms of dyspnea: Secondary | ICD-10-CM

## 2019-10-17 DIAGNOSIS — I2510 Atherosclerotic heart disease of native coronary artery without angina pectoris: Secondary | ICD-10-CM

## 2019-10-17 DIAGNOSIS — R0600 Dyspnea, unspecified: Secondary | ICD-10-CM | POA: Diagnosis present

## 2019-10-17 MED ORDER — NITROGLYCERIN 0.4 MG SL SUBL
SUBLINGUAL_TABLET | SUBLINGUAL | Status: AC
  Filled 2019-10-17: qty 2

## 2019-10-17 MED ORDER — IOHEXOL 350 MG/ML SOLN
100.0000 mL | Freq: Once | INTRAVENOUS | Status: AC | PRN
  Administered 2019-10-17: 100 mL via INTRAVENOUS

## 2019-10-17 MED ORDER — NITROGLYCERIN 0.4 MG SL SUBL
0.8000 mg | SUBLINGUAL_TABLET | Freq: Once | SUBLINGUAL | Status: AC
  Administered 2019-10-17: 08:00:00 0.8 mg via SUBLINGUAL

## 2019-10-17 NOTE — Progress Notes (Signed)
CT complete. Patient denies any complaints. Offered patient snack and beverage. 

## 2019-10-26 ENCOUNTER — Telehealth: Payer: Self-pay | Admitting: *Deleted

## 2019-10-26 ENCOUNTER — Other Ambulatory Visit: Payer: Self-pay

## 2019-10-26 ENCOUNTER — Ambulatory Visit (HOSPITAL_COMMUNITY)
Admission: RE | Admit: 2019-10-26 | Discharge: 2019-10-26 | Disposition: A | Discharge status: Home / Self Care | Payer: Medicare HMO | Source: Ambulatory Visit | Attending: Adult Health Nurse Practitioner | Admitting: Adult Health Nurse Practitioner

## 2019-10-26 DIAGNOSIS — Z1231 Encounter for screening mammogram for malignant neoplasm of breast: Secondary | ICD-10-CM | POA: Insufficient documentation

## 2019-10-26 NOTE — Telephone Encounter (Signed)
Notes recorded by Laurine Blazer, LPN on 83/41/9622 at 4:59 PM EST  Patient notified. Copy to pmd. Follow up scheduled for 12/21/2019 w/ Dr. Bronson Ing.  ------   Notes recorded by Herminio Commons, MD on 10/25/2019 at 8:37 AM EST  No, she does not.  ------   Notes recorded by Laurine Blazer, LPN on 29/79/8921 at 11:36 AM EST  Patient notified. She is not on Lopressor all the time. She only had one dose prior to CT. Does she need this as a new medication?  ------   Notes recorded by Laurine Blazer, LPN on 19/41/7408 at 5:29 PM EST  Left message to return call.   ------   Notes recorded by Herminio Commons, MD on 10/17/2019 at 4:57 PM EST  Coronary calcium score is high suggestive of a high risk for future cardiac events. However, while blockages were seen they were all mild in severity. Continue medical therapy with aspirin, atorvastatin, and metoprolol.  ------   Notes recorded by Herminio Commons, MD on 10/17/2019 at 12:21 PM EST  Await cardiac results.

## 2019-12-18 ENCOUNTER — Encounter (HOSPITAL_COMMUNITY): Payer: Self-pay

## 2019-12-18 ENCOUNTER — Telehealth: Payer: Self-pay | Admitting: Cardiovascular Disease

## 2019-12-18 ENCOUNTER — Emergency Department (HOSPITAL_COMMUNITY): Payer: Medicare HMO

## 2019-12-18 ENCOUNTER — Other Ambulatory Visit: Payer: Self-pay

## 2019-12-18 ENCOUNTER — Inpatient Hospital Stay (HOSPITAL_COMMUNITY)
Admission: EM | Admit: 2019-12-18 | Discharge: 2019-12-22 | DRG: 177 | Disposition: A | Discharge status: Home Health | Payer: Medicare HMO | Attending: Internal Medicine | Admitting: Internal Medicine

## 2019-12-18 DIAGNOSIS — Z7982 Long term (current) use of aspirin: Secondary | ICD-10-CM | POA: Diagnosis not present

## 2019-12-18 DIAGNOSIS — R110 Nausea: Secondary | ICD-10-CM | POA: Diagnosis present

## 2019-12-18 DIAGNOSIS — I471 Supraventricular tachycardia: Secondary | ICD-10-CM | POA: Diagnosis not present

## 2019-12-18 DIAGNOSIS — E876 Hypokalemia: Secondary | ICD-10-CM | POA: Diagnosis present

## 2019-12-18 DIAGNOSIS — R197 Diarrhea, unspecified: Secondary | ICD-10-CM | POA: Diagnosis present

## 2019-12-18 DIAGNOSIS — F0150 Vascular dementia without behavioral disturbance: Secondary | ICD-10-CM | POA: Diagnosis present

## 2019-12-18 DIAGNOSIS — F0280 Dementia in other diseases classified elsewhere without behavioral disturbance: Secondary | ICD-10-CM | POA: Diagnosis present

## 2019-12-18 DIAGNOSIS — U071 COVID-19: Secondary | ICD-10-CM | POA: Diagnosis present

## 2019-12-18 DIAGNOSIS — Z79890 Hormone replacement therapy (postmenopausal): Secondary | ICD-10-CM

## 2019-12-18 DIAGNOSIS — G300 Alzheimer's disease with early onset: Secondary | ICD-10-CM | POA: Diagnosis present

## 2019-12-18 DIAGNOSIS — E039 Hypothyroidism, unspecified: Secondary | ICD-10-CM | POA: Diagnosis present

## 2019-12-18 DIAGNOSIS — I10 Essential (primary) hypertension: Secondary | ICD-10-CM | POA: Diagnosis present

## 2019-12-18 DIAGNOSIS — E119 Type 2 diabetes mellitus without complications: Secondary | ICD-10-CM | POA: Diagnosis present

## 2019-12-18 DIAGNOSIS — J1282 Pneumonia due to coronavirus disease 2019: Secondary | ICD-10-CM | POA: Diagnosis present

## 2019-12-18 DIAGNOSIS — Z7984 Long term (current) use of oral hypoglycemic drugs: Secondary | ICD-10-CM

## 2019-12-18 DIAGNOSIS — Z79899 Other long term (current) drug therapy: Secondary | ICD-10-CM

## 2019-12-18 DIAGNOSIS — J9601 Acute respiratory failure with hypoxia: Secondary | ICD-10-CM | POA: Diagnosis present

## 2019-12-18 DIAGNOSIS — E785 Hyperlipidemia, unspecified: Secondary | ICD-10-CM | POA: Diagnosis present

## 2019-12-18 LAB — CBC WITH DIFFERENTIAL/PLATELET
Abs Immature Granulocytes: 0.09 10*3/uL — ABNORMAL HIGH (ref 0.00–0.07)
Basophils Absolute: 0 10*3/uL (ref 0.0–0.1)
Basophils Relative: 0 %
Eosinophils Absolute: 0 10*3/uL (ref 0.0–0.5)
Eosinophils Relative: 0 %
HCT: 36.6 % (ref 36.0–46.0)
Hemoglobin: 11.9 g/dL — ABNORMAL LOW (ref 12.0–15.0)
Immature Granulocytes: 1 %
Lymphocytes Relative: 7 %
Lymphs Abs: 0.6 10*3/uL — ABNORMAL LOW (ref 0.7–4.0)
MCH: 27.7 pg (ref 26.0–34.0)
MCHC: 32.5 g/dL (ref 30.0–36.0)
MCV: 85.1 fL (ref 80.0–100.0)
Monocytes Absolute: 0.4 10*3/uL (ref 0.1–1.0)
Monocytes Relative: 5 %
Neutro Abs: 7.8 10*3/uL — ABNORMAL HIGH (ref 1.7–7.7)
Neutrophils Relative %: 87 %
Platelets: 293 10*3/uL (ref 150–400)
RBC: 4.3 MIL/uL (ref 3.87–5.11)
RDW: 13.5 % (ref 11.5–15.5)
WBC: 9 10*3/uL (ref 4.0–10.5)
nRBC: 0 % (ref 0.0–0.2)

## 2019-12-18 LAB — COMPREHENSIVE METABOLIC PANEL
ALT: 63 U/L — ABNORMAL HIGH (ref 0–44)
AST: 90 U/L — ABNORMAL HIGH (ref 15–41)
Albumin: 3.1 g/dL — ABNORMAL LOW (ref 3.5–5.0)
Alkaline Phosphatase: 51 U/L (ref 38–126)
Anion gap: 11 (ref 5–15)
BUN: 11 mg/dL (ref 8–23)
CO2: 24 mmol/L (ref 22–32)
Calcium: 8.1 mg/dL — ABNORMAL LOW (ref 8.9–10.3)
Chloride: 100 mmol/L (ref 98–111)
Creatinine, Ser: 0.77 mg/dL (ref 0.44–1.00)
GFR calc Af Amer: >60 mL/min (ref >60)
GFR calc non Af Amer: >60 mL/min (ref >60)
Glucose, Bld: 93 mg/dL (ref 70–99)
Potassium: 3 mmol/L — ABNORMAL LOW (ref 3.5–5.1)
Sodium: 135 mmol/L (ref 135–145)
Total Bilirubin: 1.5 mg/dL — ABNORMAL HIGH (ref 0.3–1.2)
Total Protein: 6.7 g/dL (ref 6.5–8.1)

## 2019-12-18 LAB — C-REACTIVE PROTEIN: CRP: 10.4 mg/dL — ABNORMAL HIGH (ref <1.0)

## 2019-12-18 LAB — PROCALCITONIN: Procalcitonin: <0.1 ng/mL

## 2019-12-18 LAB — RESPIRATORY PANEL BY RT PCR (FLU A&B, COVID)
Influenza A by PCR: NEGATIVE
Influenza B by PCR: NEGATIVE
SARS Coronavirus 2 by RT PCR: POSITIVE — AB

## 2019-12-18 LAB — FERRITIN: Ferritin: 1465 ng/mL — ABNORMAL HIGH (ref 11–307)

## 2019-12-18 LAB — LACTATE DEHYDROGENASE: LDH: 413 U/L — ABNORMAL HIGH (ref 98–192)

## 2019-12-18 LAB — TRIGLYCERIDES: Triglycerides: 141 mg/dL (ref <150)

## 2019-12-18 LAB — MAGNESIUM: Magnesium: 2.2 mg/dL (ref 1.7–2.4)

## 2019-12-18 LAB — D-DIMER, QUANTITATIVE: D-Dimer, Quant: 1.58 ug/mL-FEU — ABNORMAL HIGH (ref 0.00–0.50)

## 2019-12-18 LAB — LACTIC ACID, PLASMA: Lactic Acid, Venous: 1 mmol/L (ref 0.5–1.9)

## 2019-12-18 LAB — FIBRINOGEN: Fibrinogen: 690 mg/dL — ABNORMAL HIGH (ref 210–475)

## 2019-12-18 MED ORDER — ZINC SULFATE 220 (50 ZN) MG PO CAPS
220.0000 mg | ORAL_CAPSULE | Freq: Every day | ORAL | Status: DC
  Administered 2019-12-19 – 2019-12-22 (×4): 220 mg via ORAL
  Filled 2019-12-18 (×4): qty 1

## 2019-12-18 MED ORDER — ONDANSETRON HCL 4 MG PO TABS: 4.0000 mg | ORAL_TABLET | Freq: Four times a day (QID) | ORAL | Status: DC | PRN

## 2019-12-18 MED ORDER — INSULIN ASPART 100 UNIT/ML ~~LOC~~ SOLN
0.0000 [IU] | Freq: Three times a day (TID) | SUBCUTANEOUS | Status: DC
  Administered 2019-12-19 (×3): 3 [IU] via SUBCUTANEOUS
  Administered 2019-12-20: 7 [IU] via SUBCUTANEOUS
  Administered 2019-12-20: 5 [IU] via SUBCUTANEOUS
  Administered 2019-12-20: 3 [IU] via SUBCUTANEOUS
  Administered 2019-12-21 (×2): 5 [IU] via SUBCUTANEOUS
  Administered 2019-12-21 – 2019-12-22 (×2): 3 [IU] via SUBCUTANEOUS
  Filled 2019-12-18 (×3): qty 1

## 2019-12-18 MED ORDER — ATORVASTATIN CALCIUM 40 MG PO TABS
40.0000 mg | ORAL_TABLET | Freq: Every day | ORAL | Status: DC
  Administered 2019-12-19 – 2019-12-22 (×4): 40 mg via ORAL
  Filled 2019-12-18 (×4): qty 1

## 2019-12-18 MED ORDER — GUAIFENESIN-DM 100-10 MG/5ML PO SYRP
10.0000 mL | ORAL_SOLUTION | ORAL | Status: DC | PRN
  Administered 2019-12-19 – 2019-12-21 (×3): 10 mL via ORAL
  Filled 2019-12-18 (×3): qty 10

## 2019-12-18 MED ORDER — SODIUM CHLORIDE 0.9 % IV SOLN
100.0000 mg | Freq: Every day | INTRAVENOUS | Status: AC
  Administered 2019-12-19 – 2019-12-22 (×4): 100 mg via INTRAVENOUS
  Filled 2019-12-18 (×2): qty 20
  Filled 2019-12-18 (×2): qty 100
  Filled 2019-12-18 (×2): qty 20

## 2019-12-18 MED ORDER — LEVOTHYROXINE SODIUM 25 MCG PO TABS
25.0000 ug | ORAL_TABLET | Freq: Every day | ORAL | Status: DC
  Administered 2019-12-19 – 2019-12-22 (×4): 25 ug via ORAL
  Filled 2019-12-18 (×4): qty 1

## 2019-12-18 MED ORDER — DONEPEZIL HCL 5 MG PO TABS
10.0000 mg | ORAL_TABLET | Freq: Every day | ORAL | Status: DC
  Administered 2019-12-19 – 2019-12-21 (×3): 10 mg via ORAL
  Filled 2019-12-18 (×5): qty 2

## 2019-12-18 MED ORDER — ASCORBIC ACID 500 MG PO TABS
500.0000 mg | ORAL_TABLET | Freq: Every day | ORAL | Status: DC
  Administered 2019-12-19 – 2019-12-22 (×4): 500 mg via ORAL
  Filled 2019-12-18 (×4): qty 1

## 2019-12-18 MED ORDER — ASPIRIN EC 81 MG PO TBEC
81.0000 mg | DELAYED_RELEASE_TABLET | Freq: Every day | ORAL | Status: DC
  Administered 2019-12-19 – 2019-12-22 (×4): 81 mg via ORAL
  Filled 2019-12-18 (×4): qty 1

## 2019-12-18 MED ORDER — ENOXAPARIN SODIUM 40 MG/0.4ML ~~LOC~~ SOLN
40.0000 mg | Freq: Every day | SUBCUTANEOUS | Status: DC
  Administered 2019-12-18 – 2019-12-21 (×4): 40 mg via SUBCUTANEOUS
  Filled 2019-12-18 (×4): qty 0.4

## 2019-12-18 MED ORDER — HYDROCOD POLST-CPM POLST ER 10-8 MG/5ML PO SUER
5.0000 mL | Freq: Two times a day (BID) | ORAL | Status: DC | PRN
  Administered 2019-12-19 – 2019-12-21 (×3): 5 mL via ORAL
  Filled 2019-12-18 (×5): qty 5

## 2019-12-18 MED ORDER — POTASSIUM CHLORIDE 20 MEQ/15ML (10%) PO SOLN
60.0000 meq | Freq: Once | ORAL | Status: AC
  Administered 2019-12-18: 60 meq via ORAL
  Filled 2019-12-18: qty 60

## 2019-12-18 MED ORDER — ALBUTEROL SULFATE HFA 108 (90 BASE) MCG/ACT IN AERS
2.0000 | INHALATION_SPRAY | Freq: Four times a day (QID) | RESPIRATORY_TRACT | Status: DC
  Administered 2019-12-19 – 2019-12-20 (×6): 2 via RESPIRATORY_TRACT
  Filled 2019-12-18: qty 6.7

## 2019-12-18 MED ORDER — MEMANTINE HCL 10 MG PO TABS
10.0000 mg | ORAL_TABLET | Freq: Two times a day (BID) | ORAL | Status: DC
  Administered 2019-12-19 – 2019-12-22 (×7): 10 mg via ORAL
  Filled 2019-12-18 (×11): qty 1

## 2019-12-18 MED ORDER — ONDANSETRON HCL 4 MG/2ML IJ SOLN: 4.0000 mg | Freq: Four times a day (QID) | INTRAMUSCULAR | Status: DC | PRN

## 2019-12-18 MED ORDER — DEXAMETHASONE SODIUM PHOSPHATE 10 MG/ML IJ SOLN
6.0000 mg | Freq: Every day | INTRAMUSCULAR | Status: DC
  Administered 2019-12-18 – 2019-12-21 (×4): 6 mg via INTRAVENOUS
  Filled 2019-12-18 (×6): qty 1

## 2019-12-18 MED ORDER — ACETAMINOPHEN 325 MG PO TABS: 650.0000 mg | ORAL_TABLET | Freq: Four times a day (QID) | ORAL | Status: DC | PRN

## 2019-12-18 MED ORDER — SODIUM CHLORIDE 0.9 % IV SOLN
200.0000 mg | Freq: Once | INTRAVENOUS | Status: AC
  Administered 2019-12-19: 200 mg via INTRAVENOUS
  Filled 2019-12-18: qty 40

## 2019-12-18 MED ORDER — ACETAMINOPHEN 500 MG PO TABS
1000.0000 mg | ORAL_TABLET | Freq: Once | ORAL | Status: AC
  Administered 2019-12-18: 1000 mg via ORAL
  Filled 2019-12-18: qty 2

## 2019-12-18 NOTE — Telephone Encounter (Signed)
Virtual Visit Pre-Appointment Phone Call  "(Name), I am calling you today to discuss your upcoming appointment. We are currently trying to limit exposure to the virus that causes COVID-19 by seeing patients at home rather than in the office."  "What is the BEST phone number to call the day of the visit?" -    [phone]  1. "Do you have or have access to (through a family member/friend) a smartphone with video capability that we can use for your visit?" a. If yes - list this number in appt notes as "cell" (if different from BEST phone #) and list the appointment type as a VIDEO visit in appointment notes b. If no - list the appointment type as a PHONE visit in appointment notes  2. Confirm consent - "In the setting of the current Covid19 crisis, you are scheduled for a (phone or video) visit with your provider on (date) at (time).  Just as we do with many in-office visits, in order for you to participate in this visit, we must obtain consent.  If you'd like, I can send this to your mychart (if signed up) or email for you to review.  Otherwise, I can obtain your verbal consent now.  All virtual visits are billed to your insurance company just like a normal visit would be.  By agreeing to a virtual visit, we'd like you to understand that the technology does not allow for your provider to perform an examination, and thus may limit your provider's ability to fully assess your condition. If your provider identifies any concerns that need to be evaluated in person, we will make arrangements to do so.  Finally, though the technology is pretty good, we cannot assure that it will always work on either your or our end, and in the setting of a video visit, we may have to convert it to a phone-only visit.  In either situation, we cannot ensure that we have a secure connection.  Are you willing to proceed?" STAFF: Did the patient verbally acknowledge consent to telehealth visit? Document YES/NO here: YES    3. Advise patient to be prepared - "Two hours prior to your appointment, go ahead and check your blood pressure, pulse, oxygen saturation, and your weight (if you have the equipment to check those) and write them all down. When your visit starts, your provider will ask you for this information. If you have an Apple Watch or Kardia device, please plan to have heart rate information ready on the day of your appointment. Please have a pen and paper handy nearby the day of the visit as well."  4. Give patient instructions for MyChart download to smartphone OR Doximity/Doxy.me as below if video visit (depending on what platform provider is using)  5. Inform patient they will receive a phone call 15 minutes prior to their appointment time (may be from unknown caller ID) so they should be prepared to answer    TELEPHONE CALL NOTE  Nicole Ward has been deemed a candidate for a follow-up tele-health visit to limit community exposure during the Covid-19 pandemic. I spoke with the patient via phone to ensure availability of phone/video source, confirm preferred email & phone number, and discuss instructions and expectations.  I reminded Nicole Ward to be prepared with any vital sign and/or heart rhythm information that could potentially be obtained via home monitoring, at the time of her visit. I reminded Nicole Ward to expect a phone call prior to her  visit.  [name] 12/18/2019 10:40 AM   INSTRUCTIONS FOR DOWNLOADING THE MYCHART APP TO SMARTPHONE  - The patient must first make sure to have activated MyChart and know their login information - If Apple, go to [employer] and type in MyChart in the search bar and download the app. If Android, ask patient to go to [employer] and type in [place] in the search bar and download the app. The app is free but as with any other app downloads, their phone may require them to verify saved payment information or Apple/Android password.   - The patient will need to then log into the app with their MyChart username and password, and select [facility] as their healthcare provider to link the account. When it is time for your visit, go to the MyChart app, find appointments, and click Begin Video Visit. Be sure to Select Allow for your device to access the Microphone and Camera for your visit. You will then be connected, and your provider will be with you shortly.  **If they have any issues connecting, or need assistance please contact MyChart service desk [phone])**  **If using a computer, in order to ensure the best quality for their visit they will need to use either of the following Internet Browsers: [employer] or Google Chrome**  IF USING DOXIMITY or DOXY.ME - The patient will receive a link just prior to their visit by text.     FULL LENGTH CONSENT FOR TELE-HEALTH VISIT   I hereby voluntarily request, consent and authorize [facility] and its employed or contracted physicians, physician assistants, nurse practitioners or other licensed health care professionals (the Practitioner), to provide me with telemedicine health care services (the "Services") as deemed necessary by the treating Practitioner. I acknowledge and consent to receive the Services by the Practitioner via telemedicine. I understand that the telemedicine visit will involve communicating with the Practitioner through live audiovisual communication technology and the disclosure of certain medical information by electronic transmission. I acknowledge that I have been given the opportunity to request an in-person assessment or other available alternative prior to the telemedicine visit and am voluntarily participating in the telemedicine visit.  I understand that I have the right to withhold or withdraw my consent to the use of telemedicine in the course of my care at any time, without affecting my right to future care or treatment, and that  the Practitioner or I may terminate the telemedicine visit at any time. I understand that I have the right to inspect all information obtained and/or recorded in the course of the telemedicine visit and may receive copies of available information for a reasonable fee.  I understand that some of the potential risks of receiving the Services via telemedicine include:  Marland Kitchen Delay or interruption in medical evaluation due to technological equipment failure or disruption; . Information transmitted may not be sufficient (e.g. poor resolution of images) to allow for appropriate medical decision making by the Practitioner; and/or  . In rare instances, security protocols could fail, causing a breach of personal health information.  Furthermore, I acknowledge that it is my responsibility to provide information about my medical history, conditions and care that is complete and accurate to the best of my ability. I acknowledge that Practitioner's advice, recommendations, and/or decision may be based on factors not within their control, such as incomplete or inaccurate data provided by me or distortions of diagnostic images or specimens that may result from electronic transmissions. I understand  that the practice of medicine is not an exact science and that Practitioner makes no warranties or guarantees regarding treatment outcomes. I acknowledge that I will receive a copy of this consent concurrently upon execution via email to the email address I last provided but may also request a printed copy by calling the office of [facility].    I understand that my insurance will be billed for this visit.   I have read or had this consent read to me. . I understand the contents of this consent, which adequately explains the benefits and risks of the Services being provided via telemedicine.  . I have been provided ample opportunity to ask questions regarding this consent and the Services and have had my questions answered to  my satisfaction. . I give my informed consent for the services to be provided through the use of telemedicine in my medical care  By participating in this telemedicine visit I agree to the above.

## 2019-12-18 NOTE — H&P (Signed)
History and Physical    Nicole Ward UKR:[id number] DOB: 1956/04/24 DOA: 12/18/2019  PCP: Bridget Hartshorn, NP  Patient coming from: Home  I have personally briefly reviewed patient's old medical records in [place]  Chief Complaint: Shortness of breath, hypoxia  HPI: Nicole Ward is a 64 y.o. female with medical history significant for Alzheimer's dementia, type 2 diabetes, hypertension, hyperlipidemia, and hypothyroidism who presents to the ED for evaluation of hypoxia in setting of COVID-19 viral infection.  Patient had new onset of symptoms on December 12, 2019 with dry cough, diarrhea, fatigue, low energy, low-grade fever.  She has had decreased food intake.  She had a positive SARS-CoV-2 NAA test performed [facility] which is available in care everywhere.  She has developed shortness of breath the last 2 days which is worsened today and she was brought to the ED for further evaluation and management.  She is still having persistent dry cough.  She reports having chest and abdominal wall discomfort due to frequent coughing.  She is feeling weak and occasionally lightheaded/dizzy.  ED Course:  Initial vitals show BP 135/69, pulse 101, RR 22, 100.7 Fahrenheit, SPO2 90% on 4 L supplemental O2 via [place].  Labs notable for potassium 3.0, sodium 135, bicarb 24, BUN 11, creatinine 0.77, AST 90, ALT 63, alk phos 51, total bilirubin 1.5, WBC 9.0, hemoglobin 11.9, platelets 293,000, lactic acid 1.0, D-dimer 1.58, procalcitonin pending, LDH 413, fibrinogen 690.  Blood cultures were obtained and pending.  Portable chest x-ray shows multifocal patchy opacities throughout the lungs.  Patient was given Tylenol and the hospitalist service was consulted admit for further evaluation and management.  Review of Systems:  All systems reviewed and are negative except as documented in history of present illness above.   Past Medical History:  Diagnosis Date  . Alzheimer's disease with early  onset ([place])   . Diabetes mellitus without complication ([place])   . Hyperlipidemia    off meds for years  . Hypertension   . Memory loss   . Thyroid disease 05/2014   Patient unsure if hyper or hypo    Past Surgical History:  Procedure Laterality Date  . BREAST BIOPSY     benign  . COLONOSCOPY  approx 2005   patient not sure of findings or where procedure done  . COLONOSCOPY N/A 10/11/2018   Procedure: COLONOSCOPY;  Surgeon: Rogene Houston, MD;  Location: [facility];  Service: Endoscopy;  Laterality: N/A;  930  . PARTIAL HYSTERECTOMY      Social History:  reports that she has never smoked. She has never used smokeless tobacco. She reports that she does not drink alcohol or use drugs.  Allergies  Allergen Reactions  . Trazodone And Nefazodone Palpitations    Family History  Problem Relation Age of Onset  . Colon cancer Father        late 50s  . Liver disease Father        cirrhosis, etoh     Prior to Admission medications   Medication Sig Start Date End Date Taking? Authorizing Provider  amLODipine (NORVASC) 5 MG tablet Take 5 mg by mouth daily. 05/15/18   [provider]  aspirin EC 81 MG tablet Take 81 mg by mouth daily.    [provider]  atorvastatin (LIPITOR) 40 MG tablet Take 40 mg by mouth daily.  05/22/15 09/19/19  [provider]  Cholecalciferol (VITAMIN D PO) Take 1 tablet by mouth daily.  [provider]  donepezil (ARICEPT) 10 MG tablet Take 1 tablet (10 mg total) by mouth at bedtime. 06/12/19   Star Age, MD  glipiZIDE (GLUCOTROL XL) 2.5 MG 24 hr tablet Take 2.5 mg by mouth daily with breakfast.    [provider]  L-LYSINE PO Take 1 capsule by mouth daily.    [provider]  levothyroxine (SYNTHROID, LEVOTHROID) 25 MCG tablet Take 25 mcg by mouth daily before breakfast.    [provider]  memantine (NAMENDA) 10 MG tablet Take 1 tablet (10 mg total) by mouth 2 (two) times daily. 06/12/19    Star Age, MD  metoprolol tartrate (LOPRESSOR) 100 MG tablet Take 1 tablet (100 mg total) by mouth once for 1 dose. 2 hours before your test 09/19/19 09/19/19  Herminio Commons, MD  Multiple Vitamins-Minerals (THERA-M) TABS Take 1 tablet by mouth daily.     [provider]  Omega-3 Fatty Acids (FISH OIL) 1000 MG CAPS Take 1,000 mg by mouth daily.     [provider]  vitamin C (ASCORBIC ACID) 500 MG tablet Take 500 mg by mouth daily.    [provider]    Physical Exam: Vitals:   12/18/19 1946 12/18/19 2100 12/18/19 2130 12/18/19 2200  BP:  118/68 127/75 112/68  Pulse:  94 91 87  Resp:  (!) 28 (!) 28 (!) 23  Temp:      TempSrc:      SpO2:  97% 97% 93%  Weight: 65.8 kg     Height: '5\' 4"'  (1.626 m)      Constitutional: Resting supine in bed, NAD, calm, comfortable Eyes: PERRL, lids and conjunctivae normal ENMT: Mucous membranes are moist. Posterior pharynx clear of any exudate or lesions.Normal dentition.  Neck: normal, supple, no masses. Respiratory: clear to auscultation bilaterally, no wheezing, no crackles. Normal respiratory effort. No accessory muscle use.  Cardiovascular: Regular rate and rhythm, no murmurs / rubs / gallops. No extremity edema. 2+ pedal pulses. Abdomen: Mild periumbilical tenderness, no masses palpated. No hepatosplenomegaly. Bowel sounds positive.  Musculoskeletal: no clubbing / cyanosis. No joint deformity upper and lower extremities. Good ROM, no contractures. Normal muscle tone.  Skin: no rashes, lesions, ulcers. No induration Neurologic: CN 2-12 grossly intact. Sensation intact, Strength 5/5 in all 4.  Psychiatric: Alert and oriented to person, place, and year. Normal mood.     Labs on Admission: I have personally reviewed following labs and imaging studies  CBC: Recent Labs  Lab 12/18/19 2058  WBC 9.0  NEUTROABS 7.8*  HGB 11.9*  HCT 36.6  MCV 85.1  PLT 073   Basic Metabolic Panel: Recent Labs  Lab  12/18/19 2058  NA 135  K 3.0*  CL 100  CO2 24  GLUCOSE 93  BUN 11  CREATININE 0.77  CALCIUM 8.1*   GFR: Estimated Creatinine Clearance: 65.4 mL/min (by C-G formula based on SCr of 0.77 mg/dL). Liver Function Tests: Recent Labs  Lab 12/18/19 2058  AST 90*  ALT 63*  ALKPHOS 51  BILITOT 1.5*  PROT 6.7  ALBUMIN 3.1*   No results for input(s): LIPASE, AMYLASE in the last 168 hours. No results for input(s): AMMONIA in the last 168 hours. Coagulation Profile: No results for input(s): INR, PROTIME in the last 168 hours. Cardiac Enzymes: No results for input(s): CKTOTAL, CKMB, CKMBINDEX, TROPONINI in the last 168 hours. BNP (last 3 results) No results for input(s): PROBNP in the last 8760 hours. HbA1C: No results for input(s): HGBA1C in the  last 72 hours. CBG: No results for input(s): GLUCAP in the last 168 hours. Lipid Profile: No results for input(s): CHOL, HDL, LDLCALC, TRIG, CHOLHDL, LDLDIRECT in the last 72 hours. Thyroid Function Tests: No results for input(s): TSH, T4TOTAL, FREET4, T3FREE, THYROIDAB in the last 72 hours. Anemia Panel: No results for input(s): VITAMINB12, FOLATE, FERRITIN, TIBC, IRON, RETICCTPCT in the last 72 hours. Urine analysis:    Component Value Date/Time   COLORURINE YELLOW 06/20/2016 2025   APPEARANCEUR CLEAR 06/20/2016 2025   LABSPEC 1.020 06/20/2016 2025   PHURINE 5.5 06/20/2016 2025   GLUCOSEU NEGATIVE 06/20/2016 2025   HGBUR NEGATIVE 06/20/2016 2025   BILIRUBINUR NEGATIVE 06/20/2016 2025   KETONESUR NEGATIVE 06/20/2016 2025   PROTEINUR NEGATIVE 06/20/2016 2025   NITRITE NEGATIVE 06/20/2016 2025   LEUKOCYTESUR NEGATIVE 06/20/2016 2025    Radiological Exams on Admission: DG Chest Port 1 View  Result Date: 12/18/2019 CLINICAL DATA:  Shortness of breath, COVID-19 positive on Saturday, cough, slight fever since Monday. EXAM: PORTABLE CHEST 1 VIEW COMPARISON:  Cardiac CTA 10/17/2019, chest radiograph 07/01/2019 FINDINGS: Multifocal  patchy airspace disease throughout the lungs most pronounced in the periphery and bases. No pneumothorax. No effusion. The cardiomediastinal contours are unremarkable. No acute osseous or soft tissue abnormality. Degenerative changes are present in the imaged spine and shoulders. IMPRESSION: Multifocal patchy areas of opacity compatible with pneumonia in the setting of COVID-19 positivity. Electronically Signed   By: Lovena Le M.D.   On: 12/18/2019 20:36    EKG: Pending.  Assessment/Plan Principal Problem:   Acute hypoxemic respiratory failure due to COVID-19 [facility]) Active Problems:   Alzheimer's disease ([place])   Essential (primary) hypertension   Diabetes mellitus, type 2 ([place])  TAYTUM SCHECK is a 64 y.o. female with medical history significant for Alzheimer's dementia, type 2 diabetes, hypertension, hyperlipidemia, and hypothyroidism who is admitted with acute hypoxemic respiratory failure due to COVID-19 viral infection.  Acute hypoxemic respiratory failure due to COVID-19 viral infection: SARS-CoV-2 NAA test + 12/15/2019 in care everywhere.  Initially requiring 4 L supplemental O2, now down to 2 L via [place].  Chest x-ray with multifocal patchy opacities bilaterally. -Start IV remdesivir per pharmacy protocol -Start IV dexamethasone 6 mg daily -Supplemental oxygen as needed -Incentive spirometer, flutter valve, albuterol inhaler -Antitussives, vitamin C, zinc  Hypokalemia: Replete orally, check magnesium and replete as needed.  Hypertension: BP soft on admission.  Holding home amlodipine for now.  Hypothyroidism: Continue Synthroid.  Hyperlipidemia: Continue atorvastatin.  Type 2 diabetes: Holding glipizide, continue sensitive SSI while in hospital.  Alzheimer's dementia: Chronic and stable.  Continue Aricept and Namenda.   DVT prophylaxis: Lovenox Code Status: Full code Family Communication: None available on admission. Disposition Plan: Pending clinical  progress Consults called: None Admission status: Inpatient for management of acute hypoxemic respiratory failure due to COVID-19 infection.   Zada Finders MD [facility]  If 7PM-7AM, please contact night-coverage [email or web address]  12/18/2019, 10:35 PM

## 2019-12-18 NOTE — ED Triage Notes (Signed)
Pt was [facility].  Per pt daughter, pt has had a cough, slight fever on Monday, sob, oxygen sat at home 75-85 on room air.  Pt does not use home oxygen.  Pt has dementia, but is able to do most of her own cares at home.  Pt has not had any fever meds in the past 24 hours.   Call Pt's Daughter Deanna Artis @ [phone] with questions

## 2019-12-18 NOTE — ED Provider Notes (Signed)
Emergency Department Provider Note   I have reviewed the triage vital signs and the nursing notes.   HISTORY  Chief Complaint Shortness of Breath (cough)   HPI Nicole Ward is a 64 y.o. female with PMH of Alzheimer's disease who tested positive for COVID-19 this past [facility] presents with fever, shortness of breath, cough, weakness.  Per the patient's daughter by phone, she noticed oxygen saturations consistently in the 75 to 85% range at home on room air.  The patient does not wear home oxygen.  She has had diarrhea without vomiting.  Patient has not been complaining about chest pain or abdominal pain but history is limited by her underlying Alzheimer's disease.  Level 5 caveat applies.  No medication for fever given. Patient lives at home with daughter. Symptoms have been present for 7-10 days in total.   Past Medical History:  Diagnosis Date  . Alzheimer's disease with early onset (HCC)   . Diabetes mellitus without complication (HCC)   . Hyperlipidemia    off meds for years  . Hypertension   . Memory loss   . Thyroid disease 05/2014   Patient unsure if hyper or hypo    Patient Active Problem List   Diagnosis Date Noted  . Acute hypoxemic respiratory failure due to COVID-19 (HCC) 12/18/2019  . Family history of colon cancer 07/12/2018  . Special screening for malignant neoplasms, colon 07/12/2018  . Memory loss 03/09/2018  . Dyslipidemia 12/16/2015  . Adult hypothyroidism 09/15/2015  . Essential (primary) hypertension 05/15/2015  . Diabetes mellitus, type 2 (HCC) 05/15/2015  . Dementia, vascular (HCC) 05/15/2015  . Alzheimer's disease (HCC) 08/05/2014  . Clinical depression 04/11/2013  . Weight loss, abnormal 06/01/2011  . Lower abdominal pain 06/01/2011  . Constipation, chronic 06/01/2011  . FH: colon cancer 06/01/2011    Past Surgical History:  Procedure Laterality Date  . BREAST BIOPSY     benign  . COLONOSCOPY  approx 2005   patient not sure of  findings or where procedure done  . COLONOSCOPY N/A 10/11/2018   Procedure: COLONOSCOPY;  Surgeon: Malissa Hippo, MD;  Location: [facility];  Service: Endoscopy;  Laterality: N/A;  930  . PARTIAL HYSTERECTOMY      Allergies Trazodone and nefazodone  Family History  Problem Relation Age of Onset  . Colon cancer Father        late 61s  . Liver disease Father        cirrhosis, etoh    Social History Social History   Tobacco Use  . Smoking status: Never Smoker  . Smokeless tobacco: Never Used  Substance Use Topics  . Alcohol use: No    Alcohol/week: 0.0 standard drinks  . Drug use: No    Review of Systems  Level 5 caveat: Dementia   ____________________________________________   PHYSICAL EXAM:  VITAL SIGNS: ED Triage Vitals  Enc Vitals Group     BP 12/18/19 1943 135/69     Pulse Rate 12/18/19 1943 (!) 101     Resp 12/18/19 1943 (!) 22     Temp 12/18/19 1943 (!) 100.7 F (38.2 C)     Temp Source 12/18/19 1943 Oral     SpO2 12/18/19 1943 90 %     Weight 12/18/19 1946 145 lb (65.8 kg)     Height 12/18/19 1946 5\' 4"  (1.626 m)   Constitutional: Alert but confused. Well appearing and in no acute distress. Eyes: Conjunctivae are normal.  Head: Atraumatic. Nose: No  congestion/rhinnorhea. Mouth/Throat: Mucous membranes are moist.  Neck: No stridor.   Cardiovascular: Tachycardia. Good peripheral circulation. Grossly normal heart sounds.   Respiratory: Slight increased respiratory effort.  No retractions. Lungs CTAB. Gastrointestinal: Soft and nontender. No distention.  Musculoskeletal: No gross deformities of extremities. Neurologic:  Normal speech and language.  Skin:  Skin is warm, dry and intact. No rash noted.   ____________________________________________   LABS (all labs ordered are listed, but only abnormal results are displayed)  Labs Reviewed  RESPIRATORY PANEL BY RT PCR (FLU A&B, COVID) - Abnormal; Notable for the following components:       Result Value   SARS Coronavirus 2 by RT PCR POSITIVE (*)    All other components within normal limits  CBC WITH DIFFERENTIAL/PLATELET - Abnormal; Notable for the following components:   Hemoglobin 11.9 (*)    Neutro Abs 7.8 (*)    Lymphs Abs 0.6 (*)    Abs Immature Granulocytes 0.09 (*)    All other components within normal limits  COMPREHENSIVE METABOLIC PANEL - Abnormal; Notable for the following components:   Potassium 3.0 (*)    Calcium 8.1 (*)    Albumin 3.1 (*)    AST 90 (*)    ALT 63 (*)    Total Bilirubin 1.5 (*)    All other components within normal limits  D-DIMER, QUANTITATIVE (NOT AT [facility]) - Abnormal; Notable for the following components:   D-Dimer, Quant 1.58 (*)    All other components within normal limits  LACTATE DEHYDROGENASE - Abnormal; Notable for the following components:   LDH 413 (*)    All other components within normal limits  FERRITIN - Abnormal; Notable for the following components:   Ferritin 1,465 (*)    All other components within normal limits  FIBRINOGEN - Abnormal; Notable for the following components:   Fibrinogen 690 (*)    All other components within normal limits  C-REACTIVE PROTEIN - Abnormal; Notable for the following components:   CRP 10.4 (*)    All other components within normal limits  CULTURE, BLOOD (ROUTINE X 2)  CULTURE, BLOOD (ROUTINE X 2)  LACTIC ACID, PLASMA  PROCALCITONIN  TRIGLYCERIDES  HIV ANTIBODY (ROUTINE TESTING W REFLEX)  CBC WITH DIFFERENTIAL/PLATELET  COMPREHENSIVE METABOLIC PANEL  C-REACTIVE PROTEIN  D-DIMER, QUANTITATIVE (NOT AT [facility])  FERRITIN  MAGNESIUM  PHOSPHORUS  MAGNESIUM  ABO/RH   ____________________________________________  EKG  No EKG performed  ____________________________________________  RADIOLOGY  DG Chest Port 1 View  Result Date: 12/18/2019 CLINICAL DATA:  Shortness of breath, COVID-19 positive on Saturday, cough, slight fever since Monday. EXAM: PORTABLE CHEST 1 VIEW COMPARISON:   Cardiac CTA 10/17/2019, chest radiograph 07/01/2019 FINDINGS: Multifocal patchy airspace disease throughout the lungs most pronounced in the periphery and bases. No pneumothorax. No effusion. The cardiomediastinal contours are unremarkable. No acute osseous or soft tissue abnormality. Degenerative changes are present in the imaged spine and shoulders. IMPRESSION: Multifocal patchy areas of opacity compatible with pneumonia in the setting of COVID-19 positivity. Electronically Signed   By: Kreg Shropshire M.D.   On: 12/18/2019 20:36    ____________________________________________   PROCEDURES  Procedure(s) performed:   Procedures  CRITICAL CARE Performed by: Maia Plan Total critical care time: 35 minutes Critical care time was exclusive of separately billable procedures and treating other patients. Critical care was necessary to treat or prevent imminent or life-threatening deterioration. Critical care was time spent personally by me on the following activities: development of treatment plan with patient and/or surrogate  as well as nursing, discussions with consultants, evaluation of patient's response to treatment, examination of patient, obtaining history from patient or surrogate, ordering and performing treatments and interventions, ordering and review of laboratory studies, ordering and review of radiographic studies, pulse oximetry and re-evaluation of patient's condition.  Nanda Quinton, MD Emergency Medicine  ____________________________________________   INITIAL IMPRESSION / ASSESSMENT AND PLAN / ED COURSE  Pertinent labs & imaging results that were available during my care of the patient were reviewed by me and considered in my medical decision making (see chart for details).   Patient with known COVID-19 infection but no test on file here presents with worsening shortness of breath and hypoxemia at home.  Oxygen saturations into the mid 80s here but patient with no increased  work of breathing.  She is currently requiring supplemental oxygen at initially 4 L/min but was able to titrate down to 2 L/min currently.  Preadmission labs reviewed. Daughter does not have outpatient COVID test result to send. Ordering PCR COVID test and will admit. CXR consistent with multifocal PNA.   Labs and imaging reviewed. Plan for admit. COVID test result found in MyChart but only after PCR was ordered and sent.   Discussed patient's case with [facility], Dr. Posey Pronto to request admission. Patient and family (if present) updated with plan. Care transferred to [facility] service.  I reviewed all nursing notes, vitals, pertinent old records, EKGs, labs, imaging (as available).  ____________________________________________  FINAL CLINICAL IMPRESSION(S) / ED DIAGNOSES  Final diagnoses:  Acute respiratory failure with hypoxia ([place])  COVID-19     MEDICATIONS GIVEN DURING THIS VISIT:  Medications  enoxaparin (LOVENOX) injection 40 mg (has no administration in time range)  remdesivir 200 mg in sodium chloride 0.9% 250 mL IVPB (has no administration in time range)    Followed by  remdesivir 100 mg in sodium chloride 0.9 % 100 mL IVPB (has no administration in time range)  albuterol (VENTOLIN HFA) 108 (90 Base) MCG/ACT inhaler 2 puff (has no administration in time range)  dexamethasone (DECADRON) injection 6 mg (has no administration in time range)  guaiFENesin-dextromethorphan (ROBITUSSIN DM) 100-10 MG/5ML syrup 10 mL (has no administration in time range)  chlorpheniramine-HYDROcodone (TUSSIONEX) 10-8 MG/5ML suspension 5 mL (has no administration in time range)  ascorbic acid (VITAMIN C) tablet 500 mg (has no administration in time range)  zinc sulfate capsule 220 mg (has no administration in time range)  acetaminophen (TYLENOL) tablet 650 mg (has no administration in time range)  ondansetron (ZOFRAN) tablet 4 mg (has no administration in time range)    Or  ondansetron (ZOFRAN) injection 4 mg (has  no administration in time range)  potassium chloride 20 MEQ/15ML (10%) solution 60 mEq (has no administration in time range)  aspirin EC tablet 81 mg (has no administration in time range)  atorvastatin (LIPITOR) tablet 40 mg (has no administration in time range)  donepezil (ARICEPT) tablet 10 mg (has no administration in time range)  levothyroxine (SYNTHROID) tablet 25 mcg (has no administration in time range)  memantine (NAMENDA) tablet 10 mg (has no administration in time range)  insulin aspart (novoLOG) injection 0-9 Units (has no administration in time range)  acetaminophen (TYLENOL) tablet 1,000 mg (1,000 mg Oral Given 12/18/19 2107)    Note:  This document was prepared using Dragon voice recognition software and may include unintentional dictation errors.  Nanda Quinton, MD, [facility] Emergency Medicine    Senia Even, [facility], MD 12/18/19 [phone]

## 2019-12-19 ENCOUNTER — Telehealth: Payer: Medicare HMO | Admitting: Cardiovascular Disease

## 2019-12-19 DIAGNOSIS — J1282 Pneumonia due to coronavirus disease 2019: Secondary | ICD-10-CM

## 2019-12-19 DIAGNOSIS — U071 COVID-19: Secondary | ICD-10-CM

## 2019-12-19 DIAGNOSIS — E785 Hyperlipidemia, unspecified: Secondary | ICD-10-CM

## 2019-12-19 LAB — COMPREHENSIVE METABOLIC PANEL
ALT: 69 U/L — ABNORMAL HIGH (ref 0–44)
AST: 88 U/L — ABNORMAL HIGH (ref 15–41)
Albumin: 2.9 g/dL — ABNORMAL LOW (ref 3.5–5.0)
Alkaline Phosphatase: 51 U/L (ref 38–126)
Anion gap: 10 (ref 5–15)
BUN: 12 mg/dL (ref 8–23)
CO2: 24 mmol/L (ref 22–32)
Calcium: 8.3 mg/dL — ABNORMAL LOW (ref 8.9–10.3)
Chloride: 103 mmol/L (ref 98–111)
Creatinine, Ser: 0.75 mg/dL (ref 0.44–1.00)
GFR calc Af Amer: >60 mL/min (ref >60)
GFR calc non Af Amer: >60 mL/min (ref >60)
Glucose, Bld: 187 mg/dL — ABNORMAL HIGH (ref 70–99)
Potassium: 4.9 mmol/L (ref 3.5–5.1)
Sodium: 137 mmol/L (ref 135–145)
Total Bilirubin: 1.1 mg/dL (ref 0.3–1.2)
Total Protein: 6.4 g/dL — ABNORMAL LOW (ref 6.5–8.1)

## 2019-12-19 LAB — CBC WITH DIFFERENTIAL/PLATELET
Abs Immature Granulocytes: 0.07 10*3/uL (ref 0.00–0.07)
Basophils Absolute: 0 10*3/uL (ref 0.0–0.1)
Basophils Relative: 0 %
Eosinophils Absolute: 0 10*3/uL (ref 0.0–0.5)
Eosinophils Relative: 0 %
HCT: 35.7 % — ABNORMAL LOW (ref 36.0–46.0)
Hemoglobin: 11.4 g/dL — ABNORMAL LOW (ref 12.0–15.0)
Immature Granulocytes: 1 %
Lymphocytes Relative: 7 %
Lymphs Abs: 0.6 10*3/uL — ABNORMAL LOW (ref 0.7–4.0)
MCH: 27.7 pg (ref 26.0–34.0)
MCHC: 31.9 g/dL (ref 30.0–36.0)
MCV: 86.9 fL (ref 80.0–100.0)
Monocytes Absolute: 0.2 10*3/uL (ref 0.1–1.0)
Monocytes Relative: 3 %
Neutro Abs: 7.1 10*3/uL (ref 1.7–7.7)
Neutrophils Relative %: 89 %
Platelets: 284 10*3/uL (ref 150–400)
RBC: 4.11 MIL/uL (ref 3.87–5.11)
RDW: 13.6 % (ref 11.5–15.5)
WBC: 8.1 10*3/uL (ref 4.0–10.5)
nRBC: 0 % (ref 0.0–0.2)

## 2019-12-19 LAB — HIV ANTIBODY (ROUTINE TESTING W REFLEX): HIV Screen 4th Generation wRfx: NONREACTIVE

## 2019-12-19 LAB — MAGNESIUM: Magnesium: 2.3 mg/dL (ref 1.7–2.4)

## 2019-12-19 LAB — GLUCOSE, CAPILLARY: Glucose-Capillary: 194 mg/dL — ABNORMAL HIGH (ref 70–99)

## 2019-12-19 LAB — FERRITIN: Ferritin: 1490 ng/mL — ABNORMAL HIGH (ref 11–307)

## 2019-12-19 LAB — D-DIMER, QUANTITATIVE: D-Dimer, Quant: 1.35 ug/mL-FEU — ABNORMAL HIGH (ref 0.00–0.50)

## 2019-12-19 LAB — ABO/RH: ABO/RH(D): A POS

## 2019-12-19 LAB — CBG MONITORING, ED
Glucose-Capillary: 217 mg/dL — ABNORMAL HIGH (ref 70–99)
Glucose-Capillary: 243 mg/dL — ABNORMAL HIGH (ref 70–99)
Glucose-Capillary: 235 mg/dL — ABNORMAL HIGH (ref 70–99)

## 2019-12-19 LAB — PHOSPHORUS: Phosphorus: 2.4 mg/dL — ABNORMAL LOW (ref 2.5–4.6)

## 2019-12-19 LAB — C-REACTIVE PROTEIN: CRP: 10 mg/dL — ABNORMAL HIGH (ref <1.0)

## 2019-12-19 MED ORDER — ALBUTEROL SULFATE HFA 108 (90 BASE) MCG/ACT IN AERS
INHALATION_SPRAY | RESPIRATORY_TRACT | Status: AC
  Administered 2019-12-19: 2 via RESPIRATORY_TRACT
  Filled 2019-12-19: qty 6.7

## 2019-12-19 NOTE — Progress Notes (Signed)
PROGRESS NOTE  BRYLEY CHRISMAN XFG:[id number] DOB: 11-Jan-1956 DOA: 12/18/2019 PCP: Bridget Hartshorn, NP  Brief History:  64 year old female with a history of Alzheimer's dementia, diabetes mellitus type 2, hypertension, hyperlipidemia, hypothyroidism presenting with shortness of breath and hypoxia.  Patient states that her symptoms began on 12/12/2019 with loose stools, low-grade fever, and nonproductive cough and decreased oral intake with nausea.  She had a positive SARS-CoV-2 NAA [facility] which is available in care everywhere.  She has developed shortness of breath the last 2 days which is worsened today and she was brought to the ED for further evaluation and management.  She is still having persistent dry cough.  She reports having chest and abdominal wall discomfort due to frequent coughing.  She is feeling weak and occasionally lightheaded/dizzy. In the emergency department, the patient had temperature 100.7 F with hypoxia.  She was placed on 2 L with improvement to 96-98%. SARS-CoV RT PCR is positive.  She was started on IV steroids and remdesivir  Assessment/Plan: Acute respiratory failure with hypoxia secondary to COVID-19 pneumonia -Personally reviewed chest x-ray--bilateral patchy infiltrates -PCT <0.10 -Ferritin 1465>>> 1490 -D-dimer 1.58>>>  1.35 -CRP 10.4>>>> 10.0 -Lactic acid 1.0 -Continue remdesivir and IV steroids  Diabetes mellitus type 2 -Holding glipizide -NovoLog sliding scale -Hemoglobin A1c  Essential hypertension -Holding amlodipine and metoprolol secondary to soft blood pressure  Vascular dementia -Restart Aricept and Namenda  Hypothyroidism -Continue Synthroid  Hyperlipidemia -Resume Lipitor       Disposition Plan:   Home in 2-3 days  Family Communication:  No Family at bedside  Consultants:  none  Code Status:  FULL  DVT Prophylaxis:  [place] Lovenox   Procedures: As Listed in Progress Note Above  Antibiotics:  None       Subjective: Patient states her breathing is a little better than yesterday but she remains short of breath.  She continues to have a nonproductive cough.  She denies any fevers, chills, headache, chest pain, nausea, vomiting, diarrhea, abdominal pain, dysuria, hematuria.  Objective: Vitals:   12/19/19 0730 12/19/19 0800 12/19/19 0830 12/19/19 1000  BP: 105/70 102/69 116/74 115/90  Pulse: 60 (!) 58 72 79  Resp: (!) 25 (!) 25 (!) 28 (!) 29  Temp:      TempSrc:      SpO2: 98% 97% 96% 96%  Weight:      Height:       No intake or output data in the 24 hours ending 12/19/19 1137 Weight change:  Exam:   General:  Pt is alert, follows commands appropriately, not in acute distress  HEENT: No icterus, No thrush, No neck mass, [place]/AT  Cardiovascular: RRR, S1/S2, no rubs, no gallops  Respiratory: Bibasilar rales.  No wheezing.  Good air movement.  Abdomen: Soft/+BS, non tender, non distended, no guarding  Extremities: No edema, No lymphangitis, No petechiae, No rashes, no synovitis   Data Reviewed: I have personally reviewed following labs and imaging studies Basic Metabolic Panel: Recent Labs  Lab 12/18/19 2056 12/18/19 2058 12/19/19 0429  NA  --  135 137  K  --  3.0* 4.9  CL  --  100 103  CO2  --  24 24  GLUCOSE  --  93 187*  BUN  --  11 12  CREATININE  --  0.77 0.75  CALCIUM  --  8.1* 8.3*  MG 2.2  --  2.3  PHOS  --   --  2.4*   Liver Function Tests: Recent Labs  Lab 12/18/19 2058 12/19/19 0429  AST 90* 88*  ALT 63* 69*  ALKPHOS 51 51  BILITOT 1.5* 1.1  PROT 6.7 6.4*  ALBUMIN 3.1* 2.9*   No results for input(s): LIPASE, AMYLASE in the last 168 hours. No results for input(s): AMMONIA in the last 168 hours. Coagulation Profile: No results for input(s): INR, PROTIME in the last 168 hours. CBC: Recent Labs  Lab 12/18/19 2058 12/19/19 0429  WBC 9.0 8.1  NEUTROABS 7.8* 7.1  HGB 11.9* 11.4*  HCT 36.6 35.7*  MCV 85.1 86.9  PLT 293 284    Cardiac Enzymes: No results for input(s): CKTOTAL, CKMB, CKMBINDEX, TROPONINI in the last 168 hours. BNP: Invalid input(s): POCBNP CBG: Recent Labs  Lab 12/19/19 0804  GLUCAP 217*   HbA1C: No results for input(s): HGBA1C in the last 72 hours. Urine analysis:    Component Value Date/Time   COLORURINE YELLOW 06/20/2016 2025   APPEARANCEUR CLEAR 06/20/2016 2025   LABSPEC 1.020 06/20/2016 2025   PHURINE 5.5 06/20/2016 2025   GLUCOSEU NEGATIVE 06/20/2016 2025   HGBUR NEGATIVE 06/20/2016 2025   BILIRUBINUR NEGATIVE 06/20/2016 2025   KETONESUR NEGATIVE 06/20/2016 2025   PROTEINUR NEGATIVE 06/20/2016 2025   NITRITE NEGATIVE 06/20/2016 2025   LEUKOCYTESUR NEGATIVE 06/20/2016 2025   Sepsis Labs: @LABRCNTIP (procalcitonin:4,lacticidven:4) ) Recent Results (from the past 240 hour(s))  Respiratory Panel by RT PCR (Flu A&B, Covid) - Nasopharyngeal Swab     Status: Abnormal   Collection Time: 12/18/19  8:47 PM   Specimen: Nasopharyngeal Swab  Result Value Ref Range Status   SARS Coronavirus 2 by RT PCR POSITIVE (A) NEGATIVE Final    Comment: RESULT CALLED TO, READ BACK BY AND VERIFIED WITH: B OAKLEY,RN @2242  12/18/19 MKELLY (NOTE) SARS-CoV-2 target nucleic acids are DETECTED. SARS-CoV-2 RNA is generally detectable in upper respiratory specimens  during the acute phase of infection. Positive results are indicative of the presence of the identified virus, but do not rule out bacterial infection or co-infection with other pathogens not detected by the test. Clinical correlation with patient history and other diagnostic information is necessary to determine patient infection status. The expected result is Negative. Fact Sheet for Patients:  Fact Sheet for Healthcare Providers: 02/15/20 This test is not yet approved or cleared by the https://www.moore.com/ FDA and  has been authorized for detection and/or diagnosis of  SARS-CoV-2 by FDA under an Emergency Use Authorization (EUA).  This EUA will remain in effect (meaning this test can be used) for  the duration of  the COVID-19 declaration under Section 564(b)(1) of the Act, 21 U.S.C. section 360bbb-3(b)(1), unless the authorization is terminated or revoked sooner.    Influenza A by PCR NEGATIVE NEGATIVE Final   Influenza B by PCR NEGATIVE NEGATIVE Final    Comment: (NOTE) The Xpert Xpress SARS-CoV-2/FLU/RSV assay is intended as an aid in  the diagnosis of influenza from Nasopharyngeal swab specimens and  should not be used as a sole basis for treatment. Nasal washings and  aspirates are unacceptable for Xpert Xpress SARS-CoV-2/FLU/RSV  testing. Fact Sheet for Patients: https://www.young.biz/ Fact Sheet for Healthcare Providers: [place] This test is not yet approved or cleared by the https://www.moore.com/ FDA and  has been authorized for detection and/or diagnosis of SARS-CoV-2 by  FDA under an Emergency Use Authorization (EUA). This EUA will remain  in effect (meaning this test can be used) for the duration of the  Covid-19 declaration under Section 564(b)(1)  of the Act, 21  U.S.C. section 360bbb-3(b)(1), unless the authorization is  terminated or revoked. Performed at [facility], [address], [place], [place] [address]   Blood Culture (routine x 2)     Status: None (Preliminary result)   Collection Time: 12/18/19  8:58 PM   Specimen: Right Antecubital; Blood  Result Value Ref Range Status   Specimen Description RIGHT ANTECUBITAL  Final   Special Requests   Final    BOTTLES DRAWN AEROBIC AND ANAEROBIC Blood Culture adequate volume   Culture   Final    NO GROWTH < 12 HOURS Performed at [facility], [address] [place], [place] [address]    Report Status PENDING  Incomplete  Blood Culture (routine x 2)     Status: None (Preliminary result)   Collection Time: 12/18/19  9:12 PM   Specimen:  BLOOD LEFT ARM  Result Value Ref Range Status   Specimen Description BLOOD LEFT ARM  Final   Special Requests   Final    BOTTLES DRAWN AEROBIC AND ANAEROBIC Blood Culture adequate volume   Culture   Final    NO GROWTH < 12 HOURS Performed at [facility], [address], [place], [place] [address]    Report Status PENDING  Incomplete     Scheduled Meds: . albuterol  2 puff Inhalation Q6H  . vitamin C  500 mg Oral Daily  . aspirin EC  81 mg Oral Daily  . atorvastatin  40 mg Oral Daily  . dexamethasone (DECADRON) injection  6 mg Intravenous q1800  . donepezil  10 mg Oral QHS  . enoxaparin (LOVENOX) injection  40 mg Subcutaneous QHS  . insulin aspart  0-9 Units Subcutaneous TID WC  . levothyroxine  25 mcg Oral QAC breakfast  . memantine  10 mg Oral BID  . zinc sulfate  220 mg Oral Daily   Continuous Infusions: . remdesivir 100 mg in NS 100 mL      Procedures/Studies: DG Chest Port 1 View  Result Date: 12/18/2019 CLINICAL DATA:  Shortness of breath, COVID-19 positive on Saturday, cough, slight fever since Monday. EXAM: PORTABLE CHEST 1 VIEW COMPARISON:  Cardiac CTA 10/17/2019, chest radiograph 07/01/2019 FINDINGS: Multifocal patchy airspace disease throughout the lungs most pronounced in the periphery and bases. No pneumothorax. No effusion. The cardiomediastinal contours are unremarkable. No acute osseous or soft tissue abnormality. Degenerative changes are present in the imaged spine and shoulders. IMPRESSION: Multifocal patchy areas of opacity compatible with pneumonia in the setting of COVID-19 positivity. Electronically Signed   By: Kreg Shropshire M.D.   On: 12/18/2019 20:36    Catarina Hartshorn, DO  [facility] Pager [phone]  If 7PM-7AM, please contact night-coverage [email or web address] Password [facility] 12/19/2019, 11:37 AM   LOS: 1 day

## 2019-12-19 NOTE — ED Notes (Signed)
Pt given a meal tray 

## 2019-12-20 DIAGNOSIS — I10 Essential (primary) hypertension: Secondary | ICD-10-CM

## 2019-12-20 LAB — C-REACTIVE PROTEIN: CRP: 5 mg/dL — ABNORMAL HIGH (ref <1.0)

## 2019-12-20 LAB — CBC WITH DIFFERENTIAL/PLATELET
Abs Immature Granulocytes: 0.21 10*3/uL — ABNORMAL HIGH (ref 0.00–0.07)
Basophils Absolute: 0 10*3/uL (ref 0.0–0.1)
Basophils Relative: 0 %
Eosinophils Absolute: 0 10*3/uL (ref 0.0–0.5)
Eosinophils Relative: 0 %
HCT: 37.5 % (ref 36.0–46.0)
Hemoglobin: 11.9 g/dL — ABNORMAL LOW (ref 12.0–15.0)
Immature Granulocytes: 2 %
Lymphocytes Relative: 6 %
Lymphs Abs: 0.7 10*3/uL (ref 0.7–4.0)
MCH: 27.7 pg (ref 26.0–34.0)
MCHC: 31.7 g/dL (ref 30.0–36.0)
MCV: 87.4 fL (ref 80.0–100.0)
Monocytes Absolute: 0.6 10*3/uL (ref 0.1–1.0)
Monocytes Relative: 5 %
Neutro Abs: 9.2 10*3/uL — ABNORMAL HIGH (ref 1.7–7.7)
Neutrophils Relative %: 87 %
Platelets: 381 10*3/uL (ref 150–400)
RBC: 4.29 MIL/uL (ref 3.87–5.11)
RDW: 13.7 % (ref 11.5–15.5)
WBC: 10.7 10*3/uL — ABNORMAL HIGH (ref 4.0–10.5)
nRBC: 0 % (ref 0.0–0.2)

## 2019-12-20 LAB — COMPREHENSIVE METABOLIC PANEL
ALT: 90 U/L — ABNORMAL HIGH (ref 0–44)
AST: 73 U/L — ABNORMAL HIGH (ref 15–41)
Albumin: 2.9 g/dL — ABNORMAL LOW (ref 3.5–5.0)
Alkaline Phosphatase: 51 U/L (ref 38–126)
Anion gap: 11 (ref 5–15)
BUN: 20 mg/dL (ref 8–23)
CO2: 22 mmol/L (ref 22–32)
Calcium: 8.3 mg/dL — ABNORMAL LOW (ref 8.9–10.3)
Chloride: 103 mmol/L (ref 98–111)
Creatinine, Ser: 0.82 mg/dL (ref 0.44–1.00)
GFR calc Af Amer: >60 mL/min (ref >60)
GFR calc non Af Amer: >60 mL/min (ref >60)
Glucose, Bld: 340 mg/dL — ABNORMAL HIGH (ref 70–99)
Potassium: 4.3 mmol/L (ref 3.5–5.1)
Sodium: 136 mmol/L (ref 135–145)
Total Bilirubin: 0.9 mg/dL (ref 0.3–1.2)
Total Protein: 6.6 g/dL (ref 6.5–8.1)

## 2019-12-20 LAB — MAGNESIUM: Magnesium: 2.5 mg/dL — ABNORMAL HIGH (ref 1.7–2.4)

## 2019-12-20 LAB — GLUCOSE, CAPILLARY
Glucose-Capillary: 329 mg/dL — ABNORMAL HIGH (ref 70–99)
Glucose-Capillary: 289 mg/dL — ABNORMAL HIGH (ref 70–99)
Glucose-Capillary: 272 mg/dL — ABNORMAL HIGH (ref 70–99)
Glucose-Capillary: 275 mg/dL — ABNORMAL HIGH (ref 70–99)

## 2019-12-20 LAB — HEMOGLOBIN A1C
Hgb A1c MFr Bld: 6.8 % — ABNORMAL HIGH (ref 4.8–5.6)
Mean Plasma Glucose: 148.46 mg/dL

## 2019-12-20 LAB — PHOSPHORUS: Phosphorus: 3.1 mg/dL (ref 2.5–4.6)

## 2019-12-20 LAB — D-DIMER, QUANTITATIVE: D-Dimer, Quant: 0.92 ug/mL-FEU — ABNORMAL HIGH (ref 0.00–0.50)

## 2019-12-20 LAB — FERRITIN: Ferritin: 1867 ng/mL — ABNORMAL HIGH (ref 11–307)

## 2019-12-20 MED ORDER — ALBUTEROL SULFATE HFA 108 (90 BASE) MCG/ACT IN AERS
2.0000 | INHALATION_SPRAY | Freq: Three times a day (TID) | RESPIRATORY_TRACT | Status: DC
  Administered 2019-12-21 – 2019-12-22 (×4): 2 via RESPIRATORY_TRACT

## 2019-12-20 MED ORDER — INSULIN ASPART 100 UNIT/ML ~~LOC~~ SOLN
4.0000 [IU] | Freq: Three times a day (TID) | SUBCUTANEOUS | Status: DC
  Administered 2019-12-21 – 2019-12-22 (×4): 4 [IU] via SUBCUTANEOUS

## 2019-12-20 NOTE — Progress Notes (Signed)
Patient noted to have about golf ball size of food in mouth left from dinner. Patient had pocket in cheek. Patient spit out food on napkin per commands. When this nurse entered room for assessment patient was asleep at that time.

## 2019-12-20 NOTE — Progress Notes (Signed)
Inpatient Diabetes Program Recommendations  AACE/ADA: New Consensus Statement on Inpatient Glycemic Control   Target Ranges:  Prepandial:   less than 140 mg/dL      Peak postprandial:   less than 180 mg/dL (1-2 hours)      Critically ill patients:  140 - 180 mg/dL   Results for Nicole Ward, Nicole Ward (MRN [id number]) as of 12/20/2019 12:46  Ref. Range 12/19/2019 08:04 12/19/2019 12:45 12/19/2019 17:04 12/19/2019 19:44 12/20/2019 07:39 12/20/2019 11:38  Glucose-Capillary Latest Ref Range: 70 - 99 mg/dL 838 (H) 184 (H) 037 (H) 194 (H) 329 (H) 289 (H)  Results for Nicole Ward, Nicole Ward (MRN [id number]) as of 12/20/2019 12:46  Ref. Range 12/20/2019 08:30  Hemoglobin A1C Latest Ref Range: 4.8 - 5.6 % 6.8 (H)   Review of Glycemic Control  Diabetes history: DM2 Outpatient Diabetes medications: Glipizide XL 2.5 mg QAM Current orders for Inpatient glycemic control: Novolog 0-9 units TID with meals; Decadron 6 mg Q24H  Inpatient Diabetes Program Recommendations:   Insulin - Basal: If steroids are continued, please consider ordering Levemir 5 units BID.  Insulin-Correction: Please consider ordering Novolog 0-5 units QHS for bedtime correction.  Insulin - Meal Coverage: If steroids are continued, please consider ordering Novolog 4 units TID with meals for meal coverage if patient eats at least 50% of meals.  Thanks, Orlando Penner, RN, MSN, CDE Diabetes Coordinator Inpatient Diabetes Program [phone] (Team Pager from 8am to 5pm)

## 2019-12-20 NOTE — ED Notes (Signed)
Patient most likely received mdi though she states she didn't. Started coughing with inhale received 4 puffs.

## 2019-12-20 NOTE — Progress Notes (Addendum)
PROGRESS NOTE  Nicole Ward:[id number] DOB: [birth year] DOA: 12/18/2019 PCP: Bridget Hartshorn, NP  Brief History:  64 year old female with a history of Alzheimer's dementia, diabetes mellitus type 2, hypertension, hyperlipidemia, hypothyroidism presenting with shortness of breath and hypoxia.  Patient states that her symptoms began on 12/12/2019 with loose stools, low-grade fever, and nonproductive cough and decreased oral intake with nausea.  She had a positive SARS-CoV-2 NAA test performed at [facility] which is available in care everywhere. She has developed shortness of breath the last 2 days which is worsened today and she was brought to the ED for further evaluation and management.She is still having persistent dry cough. She reports having chest and abdominal wall discomfort due to frequent coughing. She is feeling weak and occasionally lightheaded/dizzy. In the emergency department, the patient had temperature 100.7 F with hypoxia.  She was placed on 2 L with improvement to 96-98%. SARS-CoV RT PCR is positive.  She was started on IV steroids and remdesivir  Assessment/Plan: Acute respiratory failure with hypoxia secondary to COVID-19 pneumonia -Personally reviewed chest x-ray--bilateral patchy infiltrates -PCT <0.10 -Ferritin 1465>>> 1490>>>1867 -D-dimer 1.58>>>  1.35>>>0.92 -CRP 10.4>>>> 10.0>>>5.0 -Lactic acid 1.0 -Continue remdesivir and IV steroids  Diabetes mellitus type 2 -Holding glipizide -NovoLog sliding scale -Hemoglobin A1c--6.8  Essential hypertension -Holding amlodipine and metoprolol secondary to soft blood pressure  Vascular dementia -Restart Aricept and Namenda  Hypothyroidism -Continue Synthroid  Hyperlipidemia -Resume Lipitor       Disposition Plan:   Home vs SNF in 1-2 days  Family Communication:  Daughter updated on phone 1/14  Consultants:  none  Code Status:  FULL  DVT Prophylaxis:  [employer] [place] Lovenox    Procedures: As Listed in Progress Note Above  Antibiotics: None     Subjective: Pt states she is breathing just a little better.  Denies f/c, cp, n/v/d, abd pain.  Objective: Vitals:   12/20/19 1105 12/20/19 1110 12/20/19 1125 12/20/19 1226  BP:    135/86  Pulse: 98 (!) 109 79 (!) 101  Resp:    18  Temp:    98.2 F (36.8 C)  TempSrc:    Oral  SpO2: 92% (!) 88% 94% 95%  Weight:      Height:        Intake/Output Summary (Last 24 hours) at 12/20/2019 1639 Last data filed at 12/20/2019 1300 Gross per 24 hour  Intake 120 ml  Output -  Net 120 ml   Weight change:  Exam:   General:  Pt is alert, follows commands appropriately, not in acute distress  HEENT: No icterus, No thrush, No neck mass, [place]/AT  Cardiovascular: RRR, S1/S2, no rubs, no gallops  Respiratory: bibasilar rales. No wheeze  Abdomen: Soft/+BS, non tender, non distended, no guarding  Extremities: No edema, No lymphangitis, No petechiae, No rashes, no synovitis   Data Reviewed: I have personally reviewed following labs and imaging studies Basic Metabolic Panel: Recent Labs  Lab 12/18/19 2056 12/18/19 2058 12/19/19 0429 12/20/19 0830  NA  --  135 137 136  K  --  3.0* 4.9 4.3  CL  --  100 103 103  CO2  --  24 24 22   GLUCOSE  --  93 187* 340*  BUN  --  11 12 20   CREATININE  --  0.77 0.75 0.82  CALCIUM  --  8.1* 8.3* 8.3*  MG 2.2  --  2.3 2.5*  PHOS  --   --  2.4* 3.1   Liver Function Tests: Recent Labs  Lab 12/18/19 2058 12/19/19 0429 12/20/19 0830  AST 90* 88* 73*  ALT 63* 69* 90*  ALKPHOS 51 51 51  BILITOT 1.5* 1.1 0.9  PROT 6.7 6.4* 6.6  ALBUMIN 3.1* 2.9* 2.9*   No results for input(s): LIPASE, AMYLASE in the last 168 hours. No results for input(s): AMMONIA in the last 168 hours. Coagulation Profile: No results for input(s): INR, PROTIME in the last 168 hours. CBC: Recent Labs  Lab 12/18/19 2058 12/19/19 0429 12/20/19 0830  WBC 9.0 8.1 10.7*  NEUTROABS 7.8* 7.1 9.2*   HGB 11.9* 11.4* 11.9*  HCT 36.6 35.7* 37.5  MCV 85.1 86.9 87.4  PLT 293 284 381   Cardiac Enzymes: No results for input(s): CKTOTAL, CKMB, CKMBINDEX, TROPONINI in the last 168 hours. BNP: Invalid input(s): POCBNP CBG: Recent Labs  Lab 12/19/19 1245 12/19/19 1704 12/19/19 1944 12/20/19 0739 12/20/19 1138  GLUCAP 243* 235* 194* 329* 289*   HbA1C: Recent Labs    12/20/19 0830  HGBA1C 6.8*   Urine analysis:    Component Value Date/Time   COLORURINE YELLOW 06/20/2016 2025   APPEARANCEUR CLEAR 06/20/2016 2025   LABSPEC 1.020 06/20/2016 2025   PHURINE 5.5 06/20/2016 2025   GLUCOSEU NEGATIVE 06/20/2016 2025   HGBUR NEGATIVE 06/20/2016 2025   BILIRUBINUR NEGATIVE 06/20/2016 2025   KETONESUR NEGATIVE 06/20/2016 2025   PROTEINUR NEGATIVE 06/20/2016 2025   NITRITE NEGATIVE 06/20/2016 2025   LEUKOCYTESUR NEGATIVE 06/20/2016 2025   Sepsis Labs: @LABRCNTIP (procalcitonin:4,lacticidven:4) ) Recent Results (from the past 240 hour(s))  Respiratory Panel by RT PCR (Flu A&B, Covid) - Nasopharyngeal Swab     Status: Abnormal   Collection Time: 12/18/19  8:47 PM   Specimen: Nasopharyngeal Swab  Result Value Ref Range Status   SARS Coronavirus 2 by RT PCR POSITIVE (A) NEGATIVE Final    Comment: RESULT CALLED TO, READ BACK BY AND VERIFIED WITH: B OAKLEY,RN @2242  12/18/19 MKELLY (NOTE) SARS-CoV-2 target nucleic acids are DETECTED. SARS-CoV-2 RNA is generally detectable in upper respiratory specimens  during the acute phase of infection. Positive results are indicative of the presence of the identified virus, but do not rule out bacterial infection or co-infection with other pathogens not detected by the test. Clinical correlation with patient history and other diagnostic information is necessary to determine patient infection status. The expected result is Negative. Fact Sheet for Patients:  Fact Sheet for Healthcare Providers:  02/15/20 This test is not yet approved or cleared by the https://www.moore.com/ FDA and  has been authorized for detection and/or diagnosis of SARS-CoV-2 by FDA under an Emergency Use Authorization (EUA).  This EUA will remain in effect (meaning this test can be used) for  the duration of  the COVID-19 declaration under Section 564(b)(1) of the Act, 21 U.S.C. section 360bbb-3(b)(1), unless the authorization is terminated or revoked sooner.    Influenza A by PCR NEGATIVE NEGATIVE Final   Influenza B by PCR NEGATIVE NEGATIVE Final    Comment: (NOTE) The Xpert Xpress SARS-CoV-2/FLU/RSV assay is intended as an aid in  the diagnosis of influenza from Nasopharyngeal swab specimens and  should not be used as a sole basis for treatment. Nasal washings and  aspirates are unacceptable for Xpert Xpress SARS-CoV-2/FLU/RSV  testing. Fact Sheet for Patients: https://www.young.biz/ Fact Sheet for Healthcare Providers: [place] This test is not yet approved or cleared by the https://www.moore.com/ FDA and  has been authorized for detection and/or diagnosis of SARS-CoV-2 by  FDA under an Emergency Use Authorization (EUA). This EUA will remain  in effect (meaning this test can be used) for the duration of the  Covid-19 declaration under Section 564(b)(1) of the Act, 21  U.S.C. section 360bbb-3(b)(1), unless the authorization is  terminated or revoked. Performed at [facility], [address] [place], [place] [address]   Blood Culture (routine x 2)     Status: None (Preliminary result)   Collection Time: 12/18/19  8:58 PM   Specimen: Right Antecubital; Blood  Result Value Ref Range Status   Specimen Description RIGHT ANTECUBITAL  Final   Special Requests   Final    BOTTLES DRAWN AEROBIC AND ANAEROBIC Blood Culture adequate volume   Culture   Final    NO GROWTH 2 DAYS Performed at [facility], [address] [place], [place]  [address]    Report Status PENDING  Incomplete  Blood Culture (routine x 2)     Status: None (Preliminary result)   Collection Time: 12/18/19  9:12 PM   Specimen: BLOOD LEFT ARM  Result Value Ref Range Status   Specimen Description BLOOD LEFT ARM  Final   Special Requests   Final    BOTTLES DRAWN AEROBIC AND ANAEROBIC Blood Culture adequate volume   Culture   Final    NO GROWTH 2 DAYS Performed at [facility], [address] [place], [place] [address]    Report Status PENDING  Incomplete     Scheduled Meds: . albuterol  2 puff Inhalation Q6H  . vitamin C  500 mg Oral Daily  . aspirin EC  81 mg Oral Daily  . atorvastatin  40 mg Oral Daily  . dexamethasone (DECADRON) injection  6 mg Intravenous q1800  . donepezil  10 mg Oral QHS  . enoxaparin (LOVENOX) injection  40 mg Subcutaneous QHS  . insulin aspart  0-9 Units Subcutaneous TID WC  . levothyroxine  25 mcg Oral QAC breakfast  . memantine  10 mg Oral BID  . zinc sulfate  220 mg Oral Daily   Continuous Infusions: . remdesivir 100 mg in NS 100 mL 100 mg (12/20/19 0921)    Procedures/Studies: DG Chest Port 1 View  Result Date: 12/18/2019 CLINICAL DATA:  Shortness of breath, COVID-19 positive on Saturday, cough, slight fever since Monday. EXAM: PORTABLE CHEST 1 VIEW COMPARISON:  Cardiac CTA 10/17/2019, chest radiograph 07/01/2019 FINDINGS: Multifocal patchy airspace disease throughout the lungs most pronounced in the periphery and bases. No pneumothorax. No effusion. The cardiomediastinal contours are unremarkable. No acute osseous or soft tissue abnormality. Degenerative changes are present in the imaged spine and shoulders. IMPRESSION: Multifocal patchy areas of opacity compatible with pneumonia in the setting of COVID-19 positivity. Electronically Signed   By: Kreg Shropshire M.D.   On: 12/18/2019 20:36    Catarina Hartshorn, DO  [facility] Pager [phone]  If 7PM-7AM, please contact night-coverage [email or web address] Password [facility]  12/20/2019, 4:39 PM   LOS: 2 days

## 2019-12-21 ENCOUNTER — Telehealth: Payer: Medicare HMO | Admitting: Cardiovascular Disease

## 2019-12-21 DIAGNOSIS — I471 Supraventricular tachycardia: Secondary | ICD-10-CM

## 2019-12-21 LAB — CBC WITH DIFFERENTIAL/PLATELET
Abs Immature Granulocytes: 0.29 10*3/uL — ABNORMAL HIGH (ref 0.00–0.07)
Basophils Absolute: 0 10*3/uL (ref 0.0–0.1)
Basophils Relative: 0 %
Eosinophils Absolute: 0 10*3/uL (ref 0.0–0.5)
Eosinophils Relative: 0 %
HCT: 38.3 % (ref 36.0–46.0)
Hemoglobin: 12.4 g/dL (ref 12.0–15.0)
Immature Granulocytes: 2 %
Lymphocytes Relative: 5 %
Lymphs Abs: 0.6 10*3/uL — ABNORMAL LOW (ref 0.7–4.0)
MCH: 28 pg (ref 26.0–34.0)
MCHC: 32.4 g/dL (ref 30.0–36.0)
MCV: 86.5 fL (ref 80.0–100.0)
Monocytes Absolute: 0.8 10*3/uL (ref 0.1–1.0)
Monocytes Relative: 6 %
Neutro Abs: 11.2 10*3/uL — ABNORMAL HIGH (ref 1.7–7.7)
Neutrophils Relative %: 87 %
Platelets: 465 10*3/uL — ABNORMAL HIGH (ref 150–400)
RBC: 4.43 MIL/uL (ref 3.87–5.11)
RDW: 13.7 % (ref 11.5–15.5)
WBC: 12.9 10*3/uL — ABNORMAL HIGH (ref 4.0–10.5)
nRBC: 0.2 % (ref 0.0–0.2)

## 2019-12-21 LAB — D-DIMER, QUANTITATIVE: D-Dimer, Quant: 0.87 ug/mL-FEU — ABNORMAL HIGH (ref 0.00–0.50)

## 2019-12-21 LAB — GLUCOSE, CAPILLARY
Glucose-Capillary: 278 mg/dL — ABNORMAL HIGH (ref 70–99)
Glucose-Capillary: 268 mg/dL — ABNORMAL HIGH (ref 70–99)
Glucose-Capillary: 214 mg/dL — ABNORMAL HIGH (ref 70–99)
Glucose-Capillary: 173 mg/dL — ABNORMAL HIGH (ref 70–99)

## 2019-12-21 LAB — COMPREHENSIVE METABOLIC PANEL
ALT: 77 U/L — ABNORMAL HIGH (ref 0–44)
AST: 51 U/L — ABNORMAL HIGH (ref 15–41)
Albumin: 3.4 g/dL — ABNORMAL LOW (ref 3.5–5.0)
Alkaline Phosphatase: 61 U/L (ref 38–126)
Anion gap: 13 (ref 5–15)
BUN: 19 mg/dL (ref 8–23)
CO2: 24 mmol/L (ref 22–32)
Calcium: 8.7 mg/dL — ABNORMAL LOW (ref 8.9–10.3)
Chloride: 100 mmol/L (ref 98–111)
Creatinine, Ser: 0.75 mg/dL (ref 0.44–1.00)
GFR calc Af Amer: >60 mL/min (ref >60)
GFR calc non Af Amer: >60 mL/min (ref >60)
Glucose, Bld: 307 mg/dL — ABNORMAL HIGH (ref 70–99)
Potassium: 4.8 mmol/L (ref 3.5–5.1)
Sodium: 137 mmol/L (ref 135–145)
Total Bilirubin: 0.9 mg/dL (ref 0.3–1.2)
Total Protein: 7 g/dL (ref 6.5–8.1)

## 2019-12-21 LAB — C-REACTIVE PROTEIN: CRP: 2.4 mg/dL — ABNORMAL HIGH (ref <1.0)

## 2019-12-21 LAB — MAGNESIUM: Magnesium: 2.5 mg/dL — ABNORMAL HIGH (ref 1.7–2.4)

## 2019-12-21 LAB — PHOSPHORUS: Phosphorus: 2.7 mg/dL (ref 2.5–4.6)

## 2019-12-21 LAB — FERRITIN: Ferritin: 1750 ng/mL — ABNORMAL HIGH (ref 11–307)

## 2019-12-21 MED ORDER — INSULIN DETEMIR 100 UNIT/ML ~~LOC~~ SOLN
5.0000 [IU] | Freq: Two times a day (BID) | SUBCUTANEOUS | Status: DC
  Administered 2019-12-21 – 2019-12-22 (×3): 5 [IU] via SUBCUTANEOUS
  Filled 2019-12-21 (×7): qty 0.05

## 2019-12-21 MED ORDER — METOPROLOL TARTRATE 25 MG PO TABS
25.0000 mg | ORAL_TABLET | Freq: Two times a day (BID) | ORAL | Status: DC
  Administered 2019-12-21 – 2019-12-22 (×2): 25 mg via ORAL
  Filled 2019-12-21 (×2): qty 1

## 2019-12-21 MED ORDER — SODIUM CHLORIDE 0.9 % IV SOLN: INTRAVENOUS | Status: AC

## 2019-12-21 NOTE — Plan of Care (Signed)
  Problem: Acute Rehab PT Goals(only PT should resolve) Goal: Pt Will Go Supine/Side To Sit Outcome: Progressing Flowsheets (Taken 12/21/2019 1232) Pt will go Supine/Side to Sit: with supervision Goal: Patient Will Transfer Sit To/From Stand Outcome: Progressing Flowsheets (Taken 12/21/2019 1232) Patient will transfer sit to/from stand: with supervision Goal: Pt Will Transfer Bed To Chair/Chair To Bed Outcome: Progressing Flowsheets (Taken 12/21/2019 1232) Pt will Transfer Bed to Chair/Chair to Bed: with supervision Goal: Pt Will Ambulate Outcome: Progressing Flowsheets (Taken 12/21/2019 1232) Pt will Ambulate:  50 feet  with min guard assist  with supervision  with rolling walker  with cane   12:33 PM, 12/21/19 Ocie Bob, MPT Physical Therapist with [facility] 336 [phone] office [phone] mobile phone

## 2019-12-21 NOTE — Care Management Important Message (Signed)
Important Message  Patient Details  Name: Nicole Ward MRN: [id number] Date of Birth: 1956/08/31   Medicare Important Message Given:  Yes(RN will deliver letter to patient room)     Corey Harold 12/21/2019, 3:43 PM

## 2019-12-21 NOTE — TOC Initial Note (Signed)
Transition of Care [facility]) - Initial/Assessment Note    Patient Details  Name: Nicole Ward MRN: [id number] Date of Birth: [birth year]  Transition of Care [facility]) CM/SW Contact:    Nicole Bleak, RN Phone Number: 12/21/2019, 3:16 PM  Clinical Narrative:       Patient admitted with acute COVID, from home. PT states she walked 20 feet, if she has good family support and Rolling walker she could go home with Home health vs SNF.  CM spoke with daughter, Ms Nicole Ward she is agreeable to [facility] She also has COVID and will be home until the 25th. She can have family check in on her once she returns to work.  Choices given. Tim with [facility] accepted the referral for HHPT/RN. TOC to follow.            Expected Discharge Plan: Home w Home Health Services Barriers to Discharge: Continued Medical Work up   Patient Goals and CMS Choice Patient states their goals for this hospitalization and ongoing recovery are:: to go home. CMS Medicare.gov Compare Post Acute Care list provided to:: Patient Represenative (must comment) Choice offered to / list presented to : Adult Children  Expected Discharge Plan and Services Expected Discharge Plan: Home w Home Health Services      HH Arranged: PT, RN [facility] Agency: [facility] Date HH Agency Contacted: 12/21/19 Time HH Agency Contacted: 1515 Representative spoke with at [facility] Agency: Nicole Ward  Prior Living Arrangements/Services   Lives with:: Relatives          Need for Family Participation in Patient Care: Yes (Comment) Care giver support system in place?: Yes (comment)   Criminal Activity/Legal Involvement Pertinent to Current Situation/Hospitalization: No - Comment as needed  Activities of Daily Living Home Assistive Devices/Equipment: None ADL Screening (condition at time of admission) Patient's cognitive ability adequate to safely complete daily activities?: No Is the patient deaf or have difficulty  hearing?: No Does the patient have difficulty seeing, even when wearing glasses/contacts?: No Does the patient have difficulty concentrating, remembering, or making decisions?: No Patient able to express need for assistance with ADLs?: Yes Does the patient have difficulty dressing or bathing?: No Independently performs ADLs?: No Communication: Independent Dressing (OT): Needs assistance Is this a change from baseline?: Pre-admission baseline Grooming: Needs assistance Is this a change from baseline?: Pre-admission baseline Feeding: Independent Bathing: Needs assistance Is this a change from baseline?: Pre-admission baseline Toileting: Independent In/Out Bed: Independent Walks in Home: Independent Does the patient have difficulty walking or climbing stairs?: No Weakness of Legs: None Weakness of Arms/Hands: None  Permission Sought/Granted      Share Information with NAME: Daughter - MS Theatre manager           Emotional Assessment       Orientation: : Oriented to Self, Oriented to Place, Oriented to  Time, Oriented to Situation Alcohol / Substance Use: Not Applicable Psych Involvement: No (comment)  Admission diagnosis:  Acute respiratory failure with hypoxia (HCC) [J96.01] Acute hypoxemic respiratory failure due to COVID-19 (HCC) [U07.1, J96.01] COVID-19 [U07.1] Patient Active Problem List   Diagnosis Date Noted  . Pneumonia due to COVID-19 virus 12/19/2019  . Acute hypoxemic respiratory failure due to COVID-19 (HCC) 12/18/2019  . Family history of colon cancer 07/12/2018  . Special screening for malignant neoplasms, colon 07/12/2018  . Memory loss 03/09/2018  . Dyslipidemia 12/16/2015  . Adult hypothyroidism 09/15/2015  . Essential (primary) hypertension 05/15/2015  . Diabetes mellitus, type  2 ([place]) 05/15/2015  . Dementia, vascular ([place]) 05/15/2015  . Alzheimer's disease ([place]) 08/05/2014  . Clinical depression 04/11/2013  . Weight loss, abnormal 06/01/2011  . Lower  abdominal pain 06/01/2011  . Constipation, chronic 06/01/2011  . FH: colon cancer 06/01/2011   PCP:  Nicole Hartshorn, NP Pharmacy:   [facility] [place] [address] [place] [address] Phone: [phone] Fax: [phone]

## 2019-12-21 NOTE — Progress Notes (Signed)
PROGRESS NOTE  Nicole Ward WHQ:[id number] DOB: [birth year] DOA: 12/18/2019 PCP: Bridget Hartshorn, NP  Brief History: 64 year old female with a history of Alzheimer's dementia, diabetes mellitus type 2, hypertension, hyperlipidemia, hypothyroidism presenting with shortness of breath and hypoxia. Patient states that her symptoms began on 12/12/2019 with loose stools, low-grade fever, and nonproductive cough and decreased oral intake with nausea. She had a positive SARS-CoV-2 NAA test performed at [facility] which is available in care everywhere. She has developed shortness of breath the last 2 days which is worsened today and she was brought to the ED for further evaluation and management.She is still having persistent dry cough. She reports having chest and abdominal wall discomfort due to frequent coughing. She is feeling weak and occasionally lightheaded/dizzy. In the emergency department, the patient had temperature 100.7 F with hypoxia. She was placed on 2 L with improvement to 96-98%. SARS-CoV RT PCR is positive.She was started on IV steroids and remdesivir  Assessment/Plan: Acute respiratory failure with hypoxia secondary to COVID-19 pneumonia -Personally reviewed chest x-ray--bilateral patchy infiltrates -PCT<0.10 -Ferritin 1465>>>1490>>>1867>>>1750 -D-dimer 1.58>>>1.35>>>0.92>>>0.87 -CRP 10.4>>>>10.0>>>5.0>>>2.4 -Lactic acid 1.0 -Continue remdesivir and IV steroids  SVT -pt having HR into 120s -EKG -TSH -Echo -start metoprolol  Diabetes mellitus type 2 -Holding glipizide -NovoLog sliding scale -Hemoglobin A1c--6.8 -add levemir 5 units bid -add novolog 4 units with meals  Essential hypertension -Holding amlodipine secondary to soft blood pressure -starting metoprolol for SVT  Vascular dementia -Restart Aricept and Namenda  Hypothyroidism -Continue Synthroid  Hyperlipidemia -Resume Lipitor       Disposition Plan: Home vs  SNF in 1-2days  Family Communication:Daughter updated on phone 1/14  Consultants:none  Code Status: FULL  DVT Prophylaxis: [place] Lovenox   Procedures: As Listed in Progress Note Above  Antibiotics: None      Subjective: Patient denies fevers, chills, headache, chest pain, dyspnea, nausea, vomiting, diarrhea, abdominal pain, dysuria, hematuria, hematochezia, and melena. She is breathing better.  Objective: Vitals:   12/20/19 2112 12/21/19 0524 12/21/19 1423 12/21/19 1500  BP: (!) 149/77 140/79 (!) 144/83   Pulse: 94 (!) 102 (!) 127   Resp: (!) 23 (!) 21 20   Temp: 98.1 F (36.7 C) 98.5 F (36.9 C) 98 F (36.7 C)   TempSrc: Oral  Oral   SpO2: 99% 97% 96% 97%  Weight:      Height:        Intake/Output Summary (Last 24 hours) at 12/21/2019 1743 Last data filed at 12/21/2019 1500 Gross per 24 hour  Intake 540 ml  Output 300 ml  Net 240 ml   Weight change:  Exam:   General:  Pt is alert, follows commands appropriately, not in acute distress  HEENT: No icterus, No thrush, No neck mass, [place]/AT  Cardiovascular: RRR, S1/S2, no rubs, no gallops  Respiratory: bibasilar rales. No wheeze  Abdomen: Soft/+BS, non tender, non distended, no guarding  Extremities: No edema, No lymphangitis, No petechiae, No rashes, no synovitis   Data Reviewed: I have personally reviewed following labs and imaging studies Basic Metabolic Panel: Recent Labs  Lab 12/18/19 2056 12/18/19 2058 12/19/19 0429 12/20/19 0830 12/21/19 0607  NA  --  135 137 136 137  K  --  3.0* 4.9 4.3 4.8  CL  --  100 103 103 100  CO2  --  24 24 22 24   GLUCOSE  --  93 187* 340* 307*  BUN  --  11 12 20  19  CREATININE  --  0.77 0.75 0.82 0.75  CALCIUM  --  8.1* 8.3* 8.3* 8.7*  MG 2.2  --  2.3 2.5* 2.5*  PHOS  --   --  2.4* 3.1 2.7   Liver Function Tests: Recent Labs  Lab 12/18/19 2058 12/19/19 0429 12/20/19 0830 12/21/19 0607  AST 90* 88* 73* 51*  ALT 63* 69* 90* 77*  ALKPHOS 51  51 51 61  BILITOT 1.5* 1.1 0.9 0.9  PROT 6.7 6.4* 6.6 7.0  ALBUMIN 3.1* 2.9* 2.9* 3.4*   No results for input(s): LIPASE, AMYLASE in the last 168 hours. No results for input(s): AMMONIA in the last 168 hours. Coagulation Profile: No results for input(s): INR, PROTIME in the last 168 hours. CBC: Recent Labs  Lab 12/18/19 2058 12/19/19 0429 12/20/19 0830 12/21/19 0607  WBC 9.0 8.1 10.7* 12.9*  NEUTROABS 7.8* 7.1 9.2* 11.2*  HGB 11.9* 11.4* 11.9* 12.4  HCT 36.6 35.7* 37.5 38.3  MCV 85.1 86.9 87.4 86.5  PLT 293 284 381 465*   Cardiac Enzymes: No results for input(s): CKTOTAL, CKMB, CKMBINDEX, TROPONINI in the last 168 hours. BNP: Invalid input(s): POCBNP CBG: Recent Labs  Lab 12/20/19 1739 12/20/19 2024 12/21/19 0757 12/21/19 1119 12/21/19 1631  GLUCAP 272* 275* 278* 268* 214*   HbA1C: Recent Labs    12/20/19 0830  HGBA1C 6.8*   Urine analysis:    Component Value Date/Time   COLORURINE YELLOW 06/20/2016 2025   APPEARANCEUR CLEAR 06/20/2016 2025   LABSPEC 1.020 06/20/2016 2025   PHURINE 5.5 06/20/2016 2025   GLUCOSEU NEGATIVE 06/20/2016 2025   HGBUR NEGATIVE 06/20/2016 2025   BILIRUBINUR NEGATIVE 06/20/2016 2025   KETONESUR NEGATIVE 06/20/2016 2025   PROTEINUR NEGATIVE 06/20/2016 2025   NITRITE NEGATIVE 06/20/2016 2025   LEUKOCYTESUR NEGATIVE 06/20/2016 2025   Sepsis Labs: @LABRCNTIP (procalcitonin:4,lacticidven:4) ) Recent Results (from the past 240 hour(s))  Respiratory Panel by RT PCR (Flu A&B, Covid) - Nasopharyngeal Swab     Status: Abnormal   Collection Time: 12/18/19  8:47 PM   Specimen: Nasopharyngeal Swab  Result Value Ref Range Status   SARS Coronavirus 2 by RT PCR POSITIVE (A) NEGATIVE Final    Comment: RESULT CALLED TO, READ BACK BY AND VERIFIED WITH: B OAKLEY,RN @2242  12/18/19 MKELLY (NOTE) SARS-CoV-2 target nucleic acids are DETECTED. SARS-CoV-2 RNA is generally detectable in upper respiratory specimens  during the acute phase of  infection. Positive results are indicative of the presence of the identified virus, but do not rule out bacterial infection or co-infection with other pathogens not detected by the test. Clinical correlation with patient history and other diagnostic information is necessary to determine patient infection status. The expected result is Negative. Fact Sheet for Patients:  Fact Sheet for Healthcare Providers: 02/15/20 This test is not yet approved or cleared by the https://www.moore.com/ FDA and  has been authorized for detection and/or diagnosis of SARS-CoV-2 by FDA under an Emergency Use Authorization (EUA).  This EUA will remain in effect (meaning this test can be used) for  the duration of  the COVID-19 declaration under Section 564(b)(1) of the Act, 21 U.S.C. section 360bbb-3(b)(1), unless the authorization is terminated or revoked sooner.    Influenza A by PCR NEGATIVE NEGATIVE Final   Influenza B by PCR NEGATIVE NEGATIVE Final    Comment: (NOTE) The Xpert Xpress SARS-CoV-2/FLU/RSV assay is intended as an aid in  the diagnosis of influenza from Nasopharyngeal swab specimens and  should not be used as a sole basis for  treatment. Nasal washings and  aspirates are unacceptable for Xpert Xpress SARS-CoV-2/FLU/RSV  testing. Fact Sheet for Patients: https://www.moore.com/ Fact Sheet for Healthcare Providers: https://www.young.biz/ This test is not yet approved or cleared by the [place] FDA and  has been authorized for detection and/or diagnosis of SARS-CoV-2 by  FDA under an Emergency Use Authorization (EUA). This EUA will remain  in effect (meaning this test can be used) for the duration of the  Covid-19 declaration under Section 564(b)(1) of the Act, 21  U.S.C. section 360bbb-3(b)(1), unless the authorization is  terminated or revoked. Performed at [facility], [address], [place], [place] [address]   Blood Culture (routine x 2)     Status: None (Preliminary result)   Collection Time: 12/18/19  8:58 PM   Specimen: Right Antecubital; Blood  Result Value Ref Range Status   Specimen Description RIGHT ANTECUBITAL  Final   Special Requests   Final    BOTTLES DRAWN AEROBIC AND ANAEROBIC Blood Culture adequate volume   Culture   Final    NO GROWTH 3 DAYS Performed at [facility], [address] [place], [place] [address]    Report Status PENDING  Incomplete  Blood Culture (routine x 2)     Status: None (Preliminary result)   Collection Time: 12/18/19  9:12 PM   Specimen: BLOOD LEFT ARM  Result Value Ref Range Status   Specimen Description BLOOD LEFT ARM  Final   Special Requests   Final    BOTTLES DRAWN AEROBIC AND ANAEROBIC Blood Culture adequate volume   Culture   Final    NO GROWTH 3 DAYS Performed at [facility], [address], [place], [place] [address]    Report Status PENDING  Incomplete     Scheduled Meds: . albuterol  2 puff Inhalation TID  . vitamin C  500 mg Oral Daily  . aspirin EC  81 mg Oral Daily  . atorvastatin  40 mg Oral Daily  . dexamethasone (DECADRON) injection  6 mg Intravenous q1800  . donepezil  10 mg Oral QHS  . enoxaparin (LOVENOX) injection  40 mg Subcutaneous QHS  . insulin aspart  0-9 Units Subcutaneous TID WC  . insulin aspart  4 Units Subcutaneous TID WC  . insulin detemir  5 Units Subcutaneous BID  . levothyroxine  25 mcg Oral QAC breakfast  . memantine  10 mg Oral BID  . zinc sulfate  220 mg Oral Daily   Continuous Infusions: . remdesivir 100 mg in NS 100 mL Stopped (12/21/19 0936)    Procedures/Studies: DG Chest Port 1 View  Result Date: 12/18/2019 CLINICAL DATA:  Shortness of breath, COVID-19 positive on Saturday, cough, slight fever since Monday. EXAM: PORTABLE CHEST 1 VIEW COMPARISON:  Cardiac CTA 10/17/2019, chest radiograph 07/01/2019 FINDINGS: Multifocal patchy airspace disease throughout the  lungs most pronounced in the periphery and bases. No pneumothorax. No effusion. The cardiomediastinal contours are unremarkable. No acute osseous or soft tissue abnormality. Degenerative changes are present in the imaged spine and shoulders. IMPRESSION: Multifocal patchy areas of opacity compatible with pneumonia in the setting of COVID-19 positivity. Electronically Signed   By: Kreg Shropshire M.D.   On: 12/18/2019 20:36    Catarina Hartshorn, DO  [facility] Pager [phone]  If 7PM-7AM, please contact night-coverage [email or web address] Password [email or web address] 12/21/2019, 5:43 PM   LOS: 3 days

## 2019-12-21 NOTE — Evaluation (Signed)
Physical Therapy Evaluation Patient Details Name: Nicole Ward MRN: [id number] DOB: [birth year] Today's Date: 12/21/2019   History of Present Illness  Nicole Ward is a 64 y.o. female with medical history significant for Alzheimer's dementia, type 2 diabetes, hypertension, hyperlipidemia, and hypothyroidism who presents to the ED for evaluation of hypoxia in setting of COVID-19 viral infection.    Clinical Impression  Patient demonstrates slow labored movement for sitting up at bedside, very unsteady on feet and at severe risk for falls requiring hand held assist to ambulate in room, limited secondary to c/o fatigue and frequent coughing.  Patient tolerated sitting up in chair after therapy - RN notified.  Patient will benefit from continued physical therapy in hospital and recommended venue below to increase strength, balance, endurance for safe ADLs and gait.    Follow Up Recommendations SNF;Supervision - Intermittent;Supervision for mobility/OOB    Equipment Recommendations  Rolling walker with 5" wheels    Recommendations for Other Services       Precautions / Restrictions Precautions Precautions: Fall Restrictions Weight Bearing Restrictions: No      Mobility  Bed Mobility Overal bed mobility: Needs Assistance Bed Mobility: Supine to Sit     Supine to sit: Min assist     General bed mobility comments: slow labored movement  Transfers Overall transfer level: Needs assistance Equipment used: 1 person hand held assist Transfers: Sit to/from Stand;Stand Pivot Transfers Sit to Stand: Min assist;Min guard Stand pivot transfers: Min assist;Min guard       General transfer comment: unsteady on feet, has to lean on nearby objects for support or hand held assist  Ambulation/Gait   Gait Distance (Feet): 20 Feet Assistive device: 1 person hand held assist Gait Pattern/deviations: Decreased step length - right;Decreased step length - left;Decreased stride  length;Staggering left;Staggering right Gait velocity: decreased   General Gait Details: slow labored cadence with frequent stumbling with near loss of balance requiring hand held assist, limited secondary to c/o fatigue and frequent coughing  Stairs            Wheelchair Mobility    Modified Rankin (Stroke Patients Only)       Balance Overall balance assessment: Needs assistance Sitting-balance support: Feet supported;No upper extremity supported Sitting balance-Leahy Scale: Good Sitting balance - Comments: seated at EOB   Standing balance support: During functional activity;No upper extremity supported Standing balance-Leahy Scale: Poor Standing balance comment: fair/poor with hand held assist                             Pertinent Vitals/Pain Pain Assessment: No/denies pain    Home Living Family/patient expects to be discharged to:: Private residence Living Arrangements: Children Available Help at Discharge: Family;Available PRN/intermittently Type of Home: House Home Access: Stairs to enter Entrance Stairs-Rails: Right Entrance Stairs-Number of Steps: 2-4 Home Layout: One level   Additional Comments: Patient is poor historian and does not remember if she has any AD's    Prior Function Level of Independence: Independent         Comments: household ambulator without AD, "per patient"     Hand Dominance        Extremity/Trunk Assessment   Upper Extremity Assessment Upper Extremity Assessment: Generalized weakness    Lower Extremity Assessment Lower Extremity Assessment: Generalized weakness    Cervical / Trunk Assessment Cervical / Trunk Assessment: Normal  Communication   Communication: No difficulties  Cognition Arousal/Alertness: Awake/alert Behavior During Therapy: WFL for  tasks assessed/performed Overall Cognitive Status: History of cognitive impairments - at baseline                                         General Comments      Exercises     Assessment/Plan    PT Assessment Patient needs continued PT services  PT Problem List Decreased strength;Decreased activity tolerance;Decreased balance;Decreased mobility       PT Treatment Interventions Balance training;Gait training;Stair training;Functional mobility training;Therapeutic activities;Therapeutic exercise;Patient/family education    PT Goals (Current goals can be found in the Care Plan section)  Acute Rehab PT Goals Patient Stated Goal: return home with family to assist PT Goal Formulation: With patient Time For Goal Achievement: 01/04/20 Potential to Achieve Goals: Good    Frequency Min 3X/week   Barriers to discharge        Co-evaluation               AM-PAC PT "6 Clicks" Mobility  Outcome Measure Help needed turning from your back to your side while in a flat bed without using bedrails?: A Little Help needed moving from lying on your back to sitting on the side of a flat bed without using bedrails?: A Lot Help needed moving to and from a bed to a chair (including a wheelchair)?: A Little Help needed standing up from a chair using your arms (e.g., wheelchair or bedside chair)?: A Little Help needed to walk in hospital room?: A Little Help needed climbing 3-5 steps with a railing? : A Lot 6 Click Score: 16    End of Session   Activity Tolerance: Patient tolerated treatment well;Patient limited by fatigue Patient left: in chair;with call bell/phone within reach;with chair alarm set Nurse Communication: Mobility status PT Visit Diagnosis: Unsteadiness on feet (R26.81);Other abnormalities of gait and mobility (R26.89);Muscle weakness (generalized) (M62.81)    Time: 5852-7782 PT Time Calculation (min) (ACUTE ONLY): 28 min   Charges:   PT Evaluation $PT Eval Moderate Complexity: 1 Mod PT Treatments $Therapeutic Activity: 23-37 mins        12:30 PM, 12/21/19 Lonell Grandchild, MPT Physical Therapist with  [facility] 336 [phone] office [phone] mobile phone

## 2019-12-21 NOTE — Progress Notes (Signed)
Inpatient Diabetes Program Recommendations  AACE/ADA: New Consensus Statement on Inpatient Glycemic Control (2015)  Target Ranges:  Prepandial:   less than 140 mg/dL      Peak postprandial:   less than 180 mg/dL (1-2 hours)      Critically ill patients:  140 - 180 mg/dL   Lab Results  Component Value Date   GLUCAP 268 (H) 12/21/2019   HGBA1C 6.8 (H) 12/20/2019    Review of Glycemic Control Results for MARIGOLD, MOM (MRN [id number]) as of 12/21/2019 12:38  Ref. Range 12/20/2019 17:39 12/20/2019 20:24 12/21/2019 07:57 12/21/2019 11:19  Glucose-Capillary Latest Ref Range: 70 - 99 mg/dL 601 (H) 561 (H) 537 (H) 268 (H)   Diabetes history: DM2 Outpatient Diabetes medications: Glipizide XL 2.5 mg QAM Current orders for Inpatient glycemic control: Novolog 0-9 units TID with meals; Decadron 6 mg Q24H  Inpatient Diabetes Program Recommendations:   Insulin - Basal: If steroids are continued, please consider ordering Levemir 5 units BID.  Insulin-Correction: Please consider ordering Novolog 0-5 units QHS for bedtime correction.  Thanks, Lujean Rave, MSN, RNC-OB Diabetes Coordinator [phone] (8a-5p)

## 2019-12-22 ENCOUNTER — Inpatient Hospital Stay (HOSPITAL_COMMUNITY): Payer: Medicare HMO

## 2019-12-22 DIAGNOSIS — I471 Supraventricular tachycardia: Secondary | ICD-10-CM

## 2019-12-22 LAB — COMPREHENSIVE METABOLIC PANEL
ALT: 70 U/L — ABNORMAL HIGH (ref 0–44)
AST: 48 U/L — ABNORMAL HIGH (ref 15–41)
Albumin: 3.1 g/dL — ABNORMAL LOW (ref 3.5–5.0)
Alkaline Phosphatase: 60 U/L (ref 38–126)
Anion gap: 13 (ref 5–15)
BUN: 19 mg/dL (ref 8–23)
CO2: 25 mmol/L (ref 22–32)
Calcium: 8.7 mg/dL — ABNORMAL LOW (ref 8.9–10.3)
Chloride: 99 mmol/L (ref 98–111)
Creatinine, Ser: 0.73 mg/dL (ref 0.44–1.00)
GFR calc Af Amer: >60 mL/min (ref >60)
GFR calc non Af Amer: >60 mL/min (ref >60)
Glucose, Bld: 256 mg/dL — ABNORMAL HIGH (ref 70–99)
Potassium: 4.1 mmol/L (ref 3.5–5.1)
Sodium: 137 mmol/L (ref 135–145)
Total Bilirubin: 1.3 mg/dL — ABNORMAL HIGH (ref 0.3–1.2)
Total Protein: 6.5 g/dL (ref 6.5–8.1)

## 2019-12-22 LAB — CBC WITH DIFFERENTIAL/PLATELET
Abs Immature Granulocytes: 0.15 10*3/uL — ABNORMAL HIGH (ref 0.00–0.07)
Basophils Absolute: 0 10*3/uL (ref 0.0–0.1)
Basophils Relative: 0 %
Eosinophils Absolute: 0 10*3/uL (ref 0.0–0.5)
Eosinophils Relative: 0 %
HCT: 37.6 % (ref 36.0–46.0)
Hemoglobin: 12.3 g/dL (ref 12.0–15.0)
Immature Granulocytes: 1 %
Lymphocytes Relative: 6 %
Lymphs Abs: 0.6 10*3/uL — ABNORMAL LOW (ref 0.7–4.0)
MCH: 28.3 pg (ref 26.0–34.0)
MCHC: 32.7 g/dL (ref 30.0–36.0)
MCV: 86.4 fL (ref 80.0–100.0)
Monocytes Absolute: 0.7 10*3/uL (ref 0.1–1.0)
Monocytes Relative: 7 %
Neutro Abs: 9.1 10*3/uL — ABNORMAL HIGH (ref 1.7–7.7)
Neutrophils Relative %: 86 %
Platelets: 442 10*3/uL — ABNORMAL HIGH (ref 150–400)
RBC: 4.35 MIL/uL (ref 3.87–5.11)
RDW: 13.7 % (ref 11.5–15.5)
WBC: 10.7 10*3/uL — ABNORMAL HIGH (ref 4.0–10.5)
nRBC: 0 % (ref 0.0–0.2)

## 2019-12-22 LAB — GLUCOSE, CAPILLARY
Glucose-Capillary: 225 mg/dL — ABNORMAL HIGH (ref 70–99)
Glucose-Capillary: 211 mg/dL — ABNORMAL HIGH (ref 70–99)

## 2019-12-22 LAB — TSH: TSH: 0.675 u[IU]/mL (ref 0.350–4.500)

## 2019-12-22 LAB — D-DIMER, QUANTITATIVE: D-Dimer, Quant: 0.76 ug/mL-FEU — ABNORMAL HIGH (ref 0.00–0.50)

## 2019-12-22 LAB — ECHOCARDIOGRAM COMPLETE
Height: 64 in
Weight: 2320 oz

## 2019-12-22 LAB — PHOSPHORUS: Phosphorus: 2.6 mg/dL (ref 2.5–4.6)

## 2019-12-22 LAB — C-REACTIVE PROTEIN: CRP: 1.2 mg/dL — ABNORMAL HIGH (ref <1.0)

## 2019-12-22 LAB — FERRITIN: Ferritin: 1487 ng/mL — ABNORMAL HIGH (ref 11–307)

## 2019-12-22 LAB — MAGNESIUM: Magnesium: 2.2 mg/dL (ref 1.7–2.4)

## 2019-12-22 MED ORDER — DEXAMETHASONE 6 MG PO TABS: 6.0000 mg | ORAL_TABLET | Freq: Every day | ORAL | 0 refills | Status: DC

## 2019-12-22 MED ORDER — DEXAMETHASONE 4 MG PO TABS: 6.0000 mg | ORAL_TABLET | Freq: Every day | ORAL | Status: DC

## 2019-12-22 MED ORDER — METOPROLOL TARTRATE 25 MG PO TABS: 25.0000 mg | ORAL_TABLET | Freq: Two times a day (BID) | ORAL | 1 refills | Status: DC

## 2019-12-22 MED ORDER — ALBUTEROL SULFATE HFA 108 (90 BASE) MCG/ACT IN AERS: 2.0000 | INHALATION_SPRAY | RESPIRATORY_TRACT | Status: DC | PRN

## 2019-12-22 NOTE — Plan of Care (Signed)
  Problem: Education: Goal: Knowledge of General Education information will improve Description: Including pain rating scale, medication(s)/side effects and non-pharmacologic comfort measures 12/22/2019 1509 by Karolee Ohs, RN Outcome: Adequate for Discharge 12/22/2019 1508 by Karolee Ohs, RN Outcome: Progressing   Problem: Clinical Measurements: Goal: Ability to maintain clinical measurements within normal limits will improve Outcome: Adequate for Discharge Goal: Will remain free from infection Outcome: Adequate for Discharge Goal: Diagnostic test results will improve Outcome: Adequate for Discharge Goal: Respiratory complications will improve Outcome: Adequate for Discharge Goal: Cardiovascular complication will be avoided Outcome: Adequate for Discharge   Problem: Health Behavior/Discharge Planning: Goal: Ability to manage health-related needs will improve 12/22/2019 1509 by Karolee Ohs, RN Outcome: Adequate for Discharge 12/22/2019 1508 by Karolee Ohs, RN Outcome: Progressing   Problem: Activity: Goal: Risk for activity intolerance will decrease Outcome: Adequate for Discharge   Problem: Nutrition: Goal: Adequate nutrition will be maintained Outcome: Adequate for Discharge   Problem: Coping: Goal: Level of anxiety will decrease Outcome: Adequate for Discharge   Problem: Elimination: Goal: Will not experience complications related to bowel motility Outcome: Adequate for Discharge Goal: Will not experience complications related to urinary retention Outcome: Adequate for Discharge   Problem: Pain Managment: Goal: General experience of comfort will improve Outcome: Adequate for Discharge   Problem: Safety: Goal: Ability to remain free from injury will improve Outcome: Adequate for Discharge   Problem: Skin Integrity: Goal: Risk for impaired skin integrity will decrease Outcome: Adequate for Discharge   Problem: Education: Goal: Knowledge of risk  factors and measures for prevention of condition will improve Outcome: Adequate for Discharge   Problem: Coping: Goal: Psychosocial and spiritual needs will be supported Outcome: Adequate for Discharge   Problem: Respiratory: Goal: Will maintain a patent airway Outcome: Adequate for Discharge Goal: Complications related to the disease process, condition or treatment will be avoided or minimized Outcome: Adequate for Discharge

## 2019-12-22 NOTE — Progress Notes (Signed)
Nsg Discharge Note  Admit Date:  12/18/2019 Discharge date: 12/22/2019   Nicole Ward to be D/C'd Home per MD order.  AVS completed.  Copy for chart, and copy for patient signed, and dated. Wheeled stable patient and belongings to short stay entrance where she was picked up by her daughter. I reviewed d/c paperwork with daughter. Patient/caregiver able to verbalize understanding.  Discharge Medication: Allergies as of 12/22/2019      Reactions   Trazodone And Nefazodone Palpitations      Medication List    STOP taking these medications   amLODipine 5 MG tablet Commonly known as: NORVASC     TAKE these medications   aspirin EC 81 MG tablet Take 81 mg by mouth daily.   dexamethasone 6 MG tablet Commonly known as: DECADRON Take 1 tablet (6 mg total) by mouth daily.   donepezil 10 MG tablet Commonly known as: ARICEPT Take 1 tablet (10 mg total) by mouth at bedtime.   Fish Oil 1000 MG Caps Take 1,000 mg by mouth daily.   glipiZIDE 2.5 MG 24 hr tablet Commonly known as: GLUCOTROL XL Take 2.5 mg by mouth daily with breakfast.   hydrocortisone 2.5 % cream Apply 1 application topically 2 (two) times daily.   L-LYSINE PO Take 1 capsule by mouth daily.   levothyroxine 25 MCG tablet Commonly known as: SYNTHROID Take 25 mcg by mouth daily before breakfast.   Lipitor 40 MG tablet Generic drug: atorvastatin Take 40 mg by mouth daily.   memantine 10 MG tablet Commonly known as: NAMENDA Take 1 tablet (10 mg total) by mouth 2 (two) times daily.   metoprolol tartrate 25 MG tablet Commonly known as: LOPRESSOR Take 1 tablet (25 mg total) by mouth 2 (two) times daily. What changed:   medication strength  how much to take  when to take this  additional instructions   Thera-M Tabs Take 1 tablet by mouth daily.   Valtrex 1000 MG tablet Generic drug: valACYclovir Take 1,000 mg by mouth 2 (two) times daily.   vitamin C 500 MG tablet Commonly known as: ASCORBIC  ACID Take 500 mg by mouth daily.   VITAMIN D PO Take 1 tablet by mouth daily.       Discharge Assessment: Vitals:   12/22/19 0726 12/22/19 1510  BP:  131/72  Pulse:  75  Resp:  (!) 21  Temp:  98.2 F (36.8 C)  SpO2: 98% 99%   Skin clean, dry and intact without evidence of skin break down, no evidence of skin tears noted. IV catheter discontinued intact. Site without signs and symptoms of complications - no redness or edema noted at insertion site, patient denies c/o pain - only slight tenderness at site.  Dressing with slight pressure applied.  D/c Instructions-Education: Discharge instructions given to patient/family with verbalized understanding. D/c education completed with patient/family including follow up instructions, medication list, d/c activities limitations if indicated, with other d/c instructions as indicated by MD - patient able to verbalize understanding, all questions fully answered. Patient instructed to return to ED, call 911, or call MD for any changes in condition.  Patient escorted via WC, and D/C home via private auto.  Karolee Ohs, RN 12/22/2019 5:58 PM

## 2019-12-22 NOTE — TOC Transition Note (Signed)
Transition of Care [facility]) - CM/SW Discharge Note   Patient Details  Name: Nicole Ward MRN: [id number] Date of Birth: 12-Aug-1956  Transition of Care [facility]) CM/SW Contact:  Malcolm Metro, RN Phone Number: 12/22/2019, 3:11 PM   Clinical Narrative:   [facility] rep aware of DC today.       Patient Goals and CMS Choice Patient states their goals for this hospitalization and ongoing recovery are:: to go home. CMS Medicare.gov Compare Post Acute Care list provided to:: Patient Represenative (must comment) Choice offered to / list presented to : Adult Children   Discharge Plan and Services                 HH Arranged: PT, RN [facility] Agency: [facility] Date HH Agency Contacted: 12/21/19 Time HH Agency Contacted: 1515 Representative spoke with at [facility] Agency: Tim Justice  Social Determinants of Health (SDOH) Interventions     Readmission Risk Interventions No flowsheet data found.

## 2019-12-22 NOTE — Progress Notes (Signed)
  Echocardiogram 2D Echocardiogram has been performed.  Pieter Partridge 12/22/2019, 11:22 AM

## 2019-12-22 NOTE — Discharge Summary (Signed)
Physician Discharge Summary  Nicole Ward:[id number] DOB: 1956-08-23 DOA: 12/18/2019  PCP: Bridget Hartshorn, NP  Admit date: 12/18/2019 Discharge date: 12/22/2019  Admitted From: Home Disposition:  Home   Recommendations for Outpatient Follow-up:  1. Follow up with PCP in 1-2 weeks 2. Please obtain BMP/CBC in one week   Home Health:yes Equipment/Devices:HHPT  Discharge Condition: Stable CODE STATUS: FULL Diet recommendation: Heart Healthy   Brief/Interim Summary: 64 year old female with a history of Alzheimer's dementia, diabetes mellitus type 2, hypertension, hyperlipidemia, hypothyroidism presenting with shortness of breath and hypoxia. Patient states that her symptoms began on 12/12/2019 with loose stools, low-grade fever, and nonproductive cough and decreased oral intake with nausea. She had a positive SARS-CoV-2 NAA test performed at [facility] which is available in care everywhere. She has developed shortness of breath the last 2 days which is worsened today and she was brought to the ED for further evaluation and management.She is still having persistent dry cough. She reports having chest and abdominal wall discomfort due to frequent coughing. She is feeling weak and occasionally lightheaded/dizzy. In the emergency department, the patient had temperature 100.7 F with hypoxia. She was placed on 2 L with improvement to 96-98%. SARS-CoV RT PCR is positive.She was started on IV steroids and remdesivir.  She finished 5 days remdesivir and will d/c home with 5 more days dexamethasone.  She was weaned off oxygen.  PT recommended SNF, but she and family refused.  HHPT was set up.  Discharge Diagnoses:  Acute respiratory failure with hypoxia secondary to COVID-19 pneumonia -Personally reviewed chest x-ray--bilateral patchy infiltrates -PCT<0.10 -Ferritin 1465>>>1490>>>1867>>>1750>>>1487 -D-dimer 1.58>>>1.35>>>0.92>>>0.87>>>0.76 -CRP  10.4>>>>10.0>>>5.0>>>2.4>>>1.2 -Lactic acid 1.0 -Continue remdesivir--finished 5 days -d/c home with 5 more days dexamethasone  SVT -pt having HR into 120s -personally reviewed tele-->SVT occasionally up to upper 130s -TSH--0.675 -Echo--pending at time of d/c -start metoprolol 25 mg bid-->back to sinus  Diabetes mellitus type 2 -Holding glipizide -NovoLog sliding scale -Hemoglobin A1c--6.8 -add levemir 5 units bid -add novolog 4 units with meals  Essential hypertension -Holding amlodipine secondary to soft blood pressure -starting metoprolol for SVT  Vascular dementia -Restart Aricept and Namenda  Hypothyroidism -Continue Synthroid  Hyperlipidemia -Resume Lipitor   Discharge Instructions  Discharge Instructions    MyChart COVID-19 home monitoring program   Complete by: Dec 22, 2019    Is the patient willing to use the MyChart Mobile App for home monitoring?: Yes   Temperature monitoring   Complete by: Dec 22, 2019    After how many days would you like to receive a notification of this patient's flowsheet entries?: 1     Allergies as of 12/22/2019      Reactions   Trazodone And Nefazodone Palpitations      Medication List    STOP taking these medications   amLODipine 5 MG tablet Commonly known as: NORVASC     TAKE these medications   aspirin EC 81 MG tablet Take 81 mg by mouth daily.   dexamethasone 6 MG tablet Commonly known as: DECADRON Take 1 tablet (6 mg total) by mouth daily.   donepezil 10 MG tablet Commonly known as: ARICEPT Take 1 tablet (10 mg total) by mouth at bedtime.   Fish Oil 1000 MG Caps Take 1,000 mg by mouth daily.   glipiZIDE 2.5 MG 24 hr tablet Commonly known as: GLUCOTROL XL Take 2.5 mg by mouth daily with breakfast.   hydrocortisone 2.5 % cream Apply 1 application topically 2 (two) times daily.   L-LYSINE PO  Take 1 capsule by mouth daily.   levothyroxine 25 MCG tablet Commonly known as: SYNTHROID Take 25  mcg by mouth daily before breakfast.   Lipitor 40 MG tablet Generic drug: atorvastatin Take 40 mg by mouth daily.   memantine 10 MG tablet Commonly known as: NAMENDA Take 1 tablet (10 mg total) by mouth 2 (two) times daily.   metoprolol tartrate 25 MG tablet Commonly known as: LOPRESSOR Take 1 tablet (25 mg total) by mouth 2 (two) times daily. What changed:   medication strength  how much to take  when to take this  additional instructions   Thera-M Tabs Take 1 tablet by mouth daily.   Valtrex 1000 MG tablet Generic drug: valACYclovir Take 1,000 mg by mouth 2 (two) times daily.   vitamin C 500 MG tablet Commonly known as: ASCORBIC ACID Take 500 mg by mouth daily.   VITAMIN D PO Take 1 tablet by mouth daily.      Follow-up Information    [facility] Follow up.   Specialty: Home Health Services Why:  PT/RN Contact information: [address] [place] [address] [phone]          Allergies  Allergen Reactions  . Trazodone And Nefazodone Palpitations    Consultations:  none   Procedures/Studies: DG Chest Port 1 View  Result Date: 12/18/2019 CLINICAL DATA:  Shortness of breath, COVID-19 positive on Saturday, cough, slight fever since Monday. EXAM: PORTABLE CHEST 1 VIEW COMPARISON:  Cardiac CTA 10/17/2019, chest radiograph 07/01/2019 FINDINGS: Multifocal patchy airspace disease throughout the lungs most pronounced in the periphery and bases. No pneumothorax. No effusion. The cardiomediastinal contours are unremarkable. No acute osseous or soft tissue abnormality. Degenerative changes are present in the imaged spine and shoulders. IMPRESSION: Multifocal patchy areas of opacity compatible with pneumonia in the setting of COVID-19 positivity. Electronically Signed   By: Kreg Shropshire M.D.   On: 12/18/2019 20:36        Discharge Exam: Vitals:   12/22/19 0726 12/22/19 1510  BP:  131/72  Pulse:  75  Resp:  (!) 21  Temp:  98.2 F (36.8  C)  SpO2: 98% 99%   Vitals:   12/21/19 2122 12/22/19 0440 12/22/19 0726 12/22/19 1510  BP: 136/72 (!) 139/59  131/72  Pulse: 85 78  75  Resp: 20 18  (!) 21  Temp: 99 F (37.2 C) 97.9 F (36.6 C)  98.2 F (36.8 C)  TempSrc: Oral Oral  Oral  SpO2: 99% 99% 98% 99%  Weight:      Height:        General: Pt is alert, awake, not in acute distress Cardiovascular: RRR, S1/S2 +, no rubs, no gallops Respiratory: bibasilar rales. No wheeze Abdominal: Soft, NT, ND, bowel sounds + Extremities: no edema, no cyanosis   The results of significant diagnostics from this hospitalization (including imaging, microbiology, ancillary and laboratory) are listed below for reference.    Significant Diagnostic Studies: DG Chest Port 1 View  Result Date: 12/18/2019 CLINICAL DATA:  Shortness of breath, COVID-19 positive on Saturday, cough, slight fever since Monday. EXAM: PORTABLE CHEST 1 VIEW COMPARISON:  Cardiac CTA 10/17/2019, chest radiograph 07/01/2019 FINDINGS: Multifocal patchy airspace disease throughout the lungs most pronounced in the periphery and bases. No pneumothorax. No effusion. The cardiomediastinal contours are unremarkable. No acute osseous or soft tissue abnormality. Degenerative changes are present in the imaged spine and shoulders. IMPRESSION: Multifocal patchy areas of opacity compatible with pneumonia in the setting of  COVID-19 positivity. Electronically Signed   By: Kreg Shropshire M.D.   On: 12/18/2019 20:36     Microbiology: Recent Results (from the past 240 hour(s))  Respiratory Panel by RT PCR (Flu A&B, Covid) - Nasopharyngeal Swab     Status: Abnormal   Collection Time: 12/18/19  8:47 PM   Specimen: Nasopharyngeal Swab  Result Value Ref Range Status   SARS Coronavirus 2 by RT PCR POSITIVE (A) NEGATIVE Final    Comment: RESULT CALLED TO, READ BACK BY AND VERIFIED WITH: B OAKLEY,RN @2242  12/18/19 MKELLY (NOTE) SARS-CoV-2 target nucleic acids are DETECTED. SARS-CoV-2 RNA is  generally detectable in upper respiratory specimens  during the acute phase of infection. Positive results are indicative of the presence of the identified virus, but do not rule out bacterial infection or co-infection with other pathogens not detected by the test. Clinical correlation with patient history and other diagnostic information is necessary to determine patient infection status. The expected result is Negative. Fact Sheet for Patients:  02/15/20 Fact Sheet for Healthcare Providers: https://www.moore.com/ This test is not yet approved or cleared by the https://www.young.biz/ FDA and  has been authorized for detection and/or diagnosis of SARS-CoV-2 by FDA under an Emergency Use Authorization (EUA).  This EUA will remain in effect (meaning this test can be used) for  the duration of  the COVID-19 declaration under Section 564(b)(1) of the Act, 21 U.S.C. section 360bbb-3(b)(1), unless the authorization is terminated or revoked sooner.    Influenza A by PCR NEGATIVE NEGATIVE Final   Influenza B by PCR NEGATIVE NEGATIVE Final    Comment: (NOTE) The Xpert Xpress SARS-CoV-2/FLU/RSV assay is intended as an aid in  the diagnosis of influenza from Nasopharyngeal swab specimens and  should not be used as a sole basis for treatment. Nasal washings and  aspirates are unacceptable for Xpert Xpress SARS-CoV-2/FLU/RSV  testing. Fact Sheet for Patients: [place] Fact Sheet for Healthcare Providers: https://www.moore.com/ This test is not yet approved or cleared by the https://www.young.biz/ FDA and  has been authorized for detection and/or diagnosis of SARS-CoV-2 by  FDA under an Emergency Use Authorization (EUA). This EUA will remain  in effect (meaning this test can be used) for the duration of the  Covid-19 declaration under Section 564(b)(1) of the Act, 21  U.S.C. section 360bbb-3(b)(1), unless the  authorization is  terminated or revoked. Performed at [facility], [address], [place], [place]   Blood Culture (routine x 2)     Status: None (Preliminary result)   Collection Time: 12/18/19  8:58 PM   Specimen: Right Antecubital; Blood  Result Value Ref Range Status   Specimen Description RIGHT ANTECUBITAL  Final   Special Requests   Final    BOTTLES DRAWN AEROBIC AND ANAEROBIC Blood Culture adequate volume   Culture   Final    NO GROWTH 4 DAYS Performed at [facility], [address], [place], [place]    Report Status PENDING  Incomplete  Blood Culture (routine x 2)     Status: None (Preliminary result)   Collection Time: 12/18/19  9:12 PM   Specimen: BLOOD LEFT ARM  Result Value Ref Range Status   Specimen Description BLOOD LEFT ARM  Final   Special Requests   Final    BOTTLES DRAWN AEROBIC AND ANAEROBIC Blood Culture adequate volume   Culture   Final    NO GROWTH 4 DAYS Performed at [facility], [address] [place], [place]  Report Status PENDING  Incomplete     Labs: Basic Metabolic Panel: Recent Labs  Lab 12/18/19 2056 12/18/19 2058 12/18/19 2058 12/19/19 0429 12/19/19 0429 12/20/19 0830 12/20/19 0830 12/21/19 0607 12/22/19 0902  NA  --  135  --  137  --  136  --  137 137  K  --  3.0*   < > 4.9   < > 4.3   < > 4.8 4.1  CL  --  100  --  103  --  103  --  100 99  CO2  --  24  --  24  --  22  --  24 25  GLUCOSE  --  93  --  187*  --  340*  --  307* 256*  BUN  --  11  --  12  --  20  --  19 19  CREATININE  --  0.77  --  0.75  --  0.82  --  0.75 0.73  CALCIUM  --  8.1*  --  8.3*  --  8.3*  --  8.7* 8.7*  MG 2.2  --   --  2.3  --  2.5*  --  2.5* 2.2  PHOS  --   --   --  2.4*  --  3.1  --  2.7 2.6   < > = values in this interval not displayed.   Liver Function Tests: Recent Labs  Lab 12/18/19 2058 12/19/19 0429 12/20/19 0830 12/21/19 0607 12/22/19 0902  AST 90* 88* 73* 51* 48*  ALT 63* 69* 90* 77* 70*  ALKPHOS 51  51 51 61 60  BILITOT 1.5* 1.1 0.9 0.9 1.3*  PROT 6.7 6.4* 6.6 7.0 6.5  ALBUMIN 3.1* 2.9* 2.9* 3.4* 3.1*   No results for input(s): LIPASE, AMYLASE in the last 168 hours. No results for input(s): AMMONIA in the last 168 hours. CBC: Recent Labs  Lab 12/18/19 2058 12/19/19 0429 12/20/19 0830 12/21/19 0607 12/22/19 0902  WBC 9.0 8.1 10.7* 12.9* 10.7*  NEUTROABS 7.8* 7.1 9.2* 11.2* 9.1*  HGB 11.9* 11.4* 11.9* 12.4 12.3  HCT 36.6 35.7* 37.5 38.3 37.6  MCV 85.1 86.9 87.4 86.5 86.4  PLT 293 284 381 465* 442*   Cardiac Enzymes: No results for input(s): CKTOTAL, CKMB, CKMBINDEX, TROPONINI in the last 168 hours. BNP: Invalid input(s): POCBNP CBG: Recent Labs  Lab 12/21/19 1119 12/21/19 1631 12/21/19 2124 12/22/19 0805 12/22/19 1144  GLUCAP 268* 214* 173* 225* 211*    Time coordinating discharge:  36 minutes  Signed:  Catarina Hartshorn, DO [facility] Pager: [phone] 12/22/2019, 3:48 PM

## 2019-12-22 NOTE — Plan of Care (Signed)
  Problem: Education: Goal: Knowledge of General Education information will improve Description Including pain rating scale, medication(s)/side effects and non-pharmacologic comfort measures Outcome: Progressing   Problem: Health Behavior/Discharge Planning: Goal: Ability to manage health-related needs will improve Outcome: Progressing   

## 2019-12-24 ENCOUNTER — Ambulatory Visit: Payer: Medicare HMO | Admitting: Adult Health

## 2019-12-24 LAB — CULTURE, BLOOD (ROUTINE X 2)
Culture: NO GROWTH
Culture: NO GROWTH
Special Requests: ADEQUATE
Special Requests: ADEQUATE

## 2019-12-28 ENCOUNTER — Ambulatory Visit: Payer: Medicare HMO | Admitting: Cardiovascular Disease

## 2020-01-17 ENCOUNTER — Encounter: Payer: Self-pay | Admitting: Cardiovascular Disease

## 2020-01-17 ENCOUNTER — Telehealth (INDEPENDENT_AMBULATORY_CARE_PROVIDER_SITE_OTHER): Payer: Medicare HMO | Admitting: Cardiovascular Disease

## 2020-01-17 VITALS — BP 135/77 | HR 68 | Ht 65.0 in | Wt 143.0 lb

## 2020-01-17 DIAGNOSIS — I471 Supraventricular tachycardia: Secondary | ICD-10-CM

## 2020-01-17 DIAGNOSIS — I10 Essential (primary) hypertension: Secondary | ICD-10-CM

## 2020-01-17 DIAGNOSIS — I2583 Coronary atherosclerosis due to lipid rich plaque: Secondary | ICD-10-CM | POA: Diagnosis not present

## 2020-01-17 DIAGNOSIS — E785 Hyperlipidemia, unspecified: Secondary | ICD-10-CM

## 2020-01-17 DIAGNOSIS — I2510 Atherosclerotic heart disease of native coronary artery without angina pectoris: Secondary | ICD-10-CM | POA: Diagnosis not present

## 2020-01-17 NOTE — Patient Instructions (Signed)
Medication Instructions:  Continue all current medications.   Labwork: none  Testing/Procedures: none  Follow-Up: 6 months   Any Other Special Instructions Will Be Listed Below (If Applicable).   If you need a refill on your cardiac medications before your next appointment, please call your pharmacy.  

## 2020-01-17 NOTE — Progress Notes (Signed)
Virtual Visit via Telephone Note   This visit type was conducted due to national recommendations for restrictions regarding the COVID-19 Pandemic (e.g. social distancing) in an effort to limit this patient's exposure and mitigate transmission in our community.  Due to her co-morbid illnesses, this patient is at least at moderate risk for complications without adequate follow up.  This format is felt to be most appropriate for this patient at this time.  The patient did not have access to video technology/had technical difficulties with video requiring transitioning to audio format only (telephone).  All issues noted in this document were discussed and addressed.  No physical exam could be performed with this format.  Please refer to the patient's chart for her  consent to telehealth for [facility].   Date:  01/17/2020   ID:  Nicole Ward, DOB January 29, 1956, MRN [id number]  Patient Location: Home Provider Location: Office  PCP:  Rebecka Apley, NP  Cardiologist:  Prentice Docker, MD  Electrophysiologist:  None   Evaluation Performed:  Follow-Up Visit  Chief Complaint: Chest pain  History of Present Illness:    Nicole Ward is a 64 y.o. female with chest pain.  It appeared she was hospitalized in January 2021 for acute respiratory failure with hypoxia secondary to COVID-19 pneumonia.  She also had episodes of SVT.  TSH was normal.  She was started on metoprolol 25 mg twice daily and she converted back to sinus rhythm.  She is doing well now and denies chest pain, palpitations, and shortness of breath.   Past Medical History:  Diagnosis Date  . Alzheimer's disease with early onset (HCC)   . Diabetes mellitus without complication (HCC)   . Hyperlipidemia    off meds for years  . Hypertension   . Memory loss   . Thyroid disease 05/2014   Patient unsure if hyper or hypo   Past Surgical History:  Procedure Laterality Date  . BREAST BIOPSY     benign  .  COLONOSCOPY  approx 2005   patient not sure of findings or where procedure done  . COLONOSCOPY N/A 10/11/2018   Procedure: COLONOSCOPY;  Surgeon: Malissa Hippo, MD;  Location: [facility];  Service: Endoscopy;  Laterality: N/A;  930  . PARTIAL HYSTERECTOMY       Current Meds  Medication Sig  . aspirin EC 81 MG tablet Take 81 mg by mouth daily.  Marland Kitchen atorvastatin (LIPITOR) 40 MG tablet Take 40 mg by mouth daily.   . Cholecalciferol (VITAMIN D PO) Take 1 tablet by mouth daily.  Marland Kitchen dexamethasone (DECADRON) 6 MG tablet Take 1 tablet (6 mg total) by mouth daily.  Marland Kitchen donepezil (ARICEPT) 10 MG tablet Take 1 tablet (10 mg total) by mouth at bedtime.  Marland Kitchen glipiZIDE (GLUCOTROL XL) 2.5 MG 24 hr tablet Take 2.5 mg by mouth daily with breakfast.  . hydrocortisone 2.5 % cream Apply 1 application topically as needed.   . L-LYSINE PO Take 1 capsule by mouth daily.  Marland Kitchen levothyroxine (SYNTHROID, LEVOTHROID) 25 MCG tablet Take 25 mcg by mouth daily before breakfast.  . memantine (NAMENDA) 10 MG tablet Take 1 tablet (10 mg total) by mouth 2 (two) times daily.  . Multiple Vitamins-Minerals (THERA-M) TABS Take 1 tablet by mouth daily.   . Omega-3 Fatty Acids (FISH OIL) 1000 MG CAPS Take 1,000 mg by mouth daily.   . valACYclovir (VALTREX) 1000 MG tablet Take 1,000 mg by mouth as needed.   . vitamin C (ASCORBIC  ACID) 500 MG tablet Take 500 mg by mouth daily.     Allergies:   Trazodone and nefazodone   Social History   Tobacco Use  . Smoking status: Never Smoker  . Smokeless tobacco: Never Used  Substance Use Topics  . Alcohol use: No    Alcohol/week: 0.0 standard drinks  . Drug use: No     Family Hx: The patient's family history includes Colon cancer in her father; Liver disease in her father.  ROS:   Please see the history of present illness.     All other systems reviewed and are negative.   Prior CV studies:   The following studies were reviewed today:  Coronary CT angiography  10/17/2019:  FINDINGS: Non-cardiac: See separate report from [facility].  Pulmonary veins drain normally to the left atrium.  Calcium Score: 290 Agatston units.  Coronary Arteries: Right dominant with no anomalies  LM: No plaque or stenosis.  LAD system: Calcified plaque proximally, mild (<50%) stenosis. Moderate D1 with calcified plaque proximally, mild (<50%) stenosis.  Circumflex system: Relatively small LCx. Mixed plaque proximally with mild (<50%) stenosis.  RCA system: Mixed plaque proximal RCA, mild (<50%) stenosis. Mixed plaque mid RCA, mild (<50%) stenosis.  IMPRESSION: 1. Coronary artery calcium score 290 Agatston units. This places the patient in the 92nd percentile for age and gender, suggesting high risk for future cardiac events.  2.  Nonobstructive coronary disease.   Echocardiogram 12/22/2019:  1. Left ventricular ejection fraction, by visual estimation, is 60 to  65%. The left ventricle has normal function. There is no left ventricular  hypertrophy.  2. Left ventricular diastolic parameters are consistent with Grade I  diastolic dysfunction (impaired relaxation).  3. The left ventricle has no regional wall motion abnormalities.  4. Global right ventricle has normal systolic function.The right  ventricular size is normal. No increase in right ventricular wall  thickness.  5. Left atrial size was normal.  6. Right atrial size was normal.  7. The mitral valve is abnormal. Trivial mitral valve regurgitation.  8. The tricuspid valve is grossly normal.  9. The aortic valve is tricuspid. Aortic valve regurgitation is trivial.  Mild aortic valve sclerosis without stenosis.  10. The pulmonic valve was grossly normal. Pulmonic valve regurgitation is  not visualized.  11. The inferior vena cava is normal in size with greater than 50%  respiratory variability, suggesting right atrial pressure of 3 mmHg.    Labs/Other Tests and Data  Reviewed:    EKG:  No ECG reviewed.  Recent Labs: 12/22/2019: ALT 70; BUN 19; Creatinine, Ser 0.73; Hemoglobin 12.3; Magnesium 2.2; Platelets 442; Potassium 4.1; Sodium 137; TSH 0.675   Recent Lipid Panel Lab Results  Component Value Date/Time   CHOL 347 05/21/2011 04:35 PM   TRIG 141 12/18/2019 08:58 PM   LDLCALC 231 05/21/2011 04:35 PM    Wt Readings from Last 3 Encounters:  01/17/20 143 lb (64.9 kg)  12/18/19 145 lb (65.8 kg)  09/19/19 153 lb (69.4 kg)     Objective:    Vital Signs:  BP 135/77   Pulse 68   Ht 5\' 5"  (1.651 m)   Wt 143 lb (64.9 kg)   BMI 23.80 kg/m    VITAL SIGNS:  reviewed  ASSESSMENT & PLAN:    1.  Coronary artery disease: Coronary artery calcium score 290 Agatston units. This places the patient in the 92nd percentile for age and gender, suggesting high risk for future cardiac events.  Continue aggressive risk  factor modification for primary prevention.  Symptomatically stable.  2.  Hypertension: Controlled on present therapy.  No changes.  3.  Hyperlipidemia: Continue atorvastatin.  4. PSVT: Symptomatically stable.  This occurred in the context of COVID-19 pneumonia.  Continue metoprolol 25 mg bid.    COVID-19 Education: The signs and symptoms of COVID-19 were discussed with the patient and how to seek care for testing (follow up with PCP or arrange E-visit).  The importance of social distancing was discussed today.  Time:   Today, I have spent 15 minutes with the patient with telehealth technology discussing the above problems.     Medication Adjustments/Labs and Tests Ordered: Current medicines are reviewed at length with the patient today.  Concerns regarding medicines are outlined above.   Tests Ordered: No orders of the defined types were placed in this encounter.   Medication Changes: No orders of the defined types were placed in this encounter.   Follow Up:  Virtual Visit  in 6 month(s)  Signed, Kate Sable, MD   01/17/2020 11:12 AM    [place]

## 2020-03-05 ENCOUNTER — Other Ambulatory Visit: Payer: Self-pay

## 2020-03-05 ENCOUNTER — Ambulatory Visit: Payer: Medicare HMO | Admitting: Adult Health

## 2020-03-05 DIAGNOSIS — R413 Other amnesia: Secondary | ICD-10-CM

## 2020-03-05 MED ORDER — DONEPEZIL HCL 10 MG PO TABS: 10.0000 mg | ORAL_TABLET | Freq: Every day | ORAL | 3 refills | Status: DC

## 2020-03-05 MED ORDER — MEMANTINE HCL 10 MG PO TABS: 10.0000 mg | ORAL_TABLET | Freq: Two times a day (BID) | ORAL | 3 refills | Status: DC

## 2020-03-05 NOTE — Patient Instructions (Signed)
Your Plan:  Continue Aricept  Increase Namenda 10 mg twice a day If your symptoms worsen or you develop new symptoms please let us know.       Thank you for coming to see us at [facility]. I hope we have been able to provide you high quality care today.  You may receive a patient satisfaction survey over the next few weeks. We would appreciate your feedback and comments so that we may continue to improve ourselves and the health of our patients.  

## 2020-03-05 NOTE — Progress Notes (Addendum)
PATIENT: Nicole Ward DOB: Jun 14, 1956  REASON FOR VISIT: follow up HISTORY FROM: patient  HISTORY OF PRESENT ILLNESS: Today 03/05/20:  Nicole Ward is a 64 year old female with a history of memory disturbance.  She returns today for follow-up.  She feels that her memory has remained stable.  She lives with her daughter.  She is able to complete all ADLs independently.  She does operate a motor vehicle but only drives to familiar local places.  Denies any long distance driving.  She denies any trouble sleeping.  No change in her mood or behavior.  Denies hallucinations.  She manages her own medications.  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  Her daughter helps her with her finances.  She remains on Aricept at bedtime.  Reports that her prescription for Namenda still stated 1 time a day.  So she is only been taking it at night.  She returns today for an evaluation.  HISTORY (copied from Dr. Teofilo Pod note) 06/12/2019: She reports feeling stable, she does drive locally, typically to her mom's house which is within 15 minutes drive and her sister's house which is within 5 minutes of her house.  Her daughter reports that there are no new symptoms but she has seen some in her mother's short-term memory.  The patient lives with her daughter and daughters 27 year old daughter.  She reports that her primary care nurse practitioner recently refilled the Namenda for her and only prescribed 1 pill in the evening, she has been taking that for the past month but would like to go back to taking it twice daily as before.  She denies any depression or anxiety at this time.  She may not always hydrate well with water, estimates that she drinks about 3 cups of water per day and some juice in between.  She does not drink coffee.  REVIEW OF SYSTEMS: Out of a complete 14 system review of symptoms, the patient complains only of the following symptoms, and all other reviewed systems are negative.  See  HPI  ALLERGIES: Allergies  Allergen Reactions  . Trazodone And Nefazodone Palpitations    HOME MEDICATIONS: Outpatient Medications Prior to Visit  Medication Sig Dispense Refill  . aspirin EC 81 MG tablet Take 81 mg by mouth daily.    Marland Kitchen atorvastatin (LIPITOR) 40 MG tablet Take 40 mg by mouth daily.     . Cholecalciferol (VITAMIN D PO) Take 1 tablet by mouth daily.    Marland Kitchen dexamethasone (DECADRON) 6 MG tablet Take 1 tablet (6 mg total) by mouth daily. 5 tablet 0  . donepezil (ARICEPT) 10 MG tablet Take 1 tablet (10 mg total) by mouth at bedtime. 90 tablet 3  . glipiZIDE (GLUCOTROL XL) 2.5 MG 24 hr tablet Take 2.5 mg by mouth daily with breakfast.    . hydrocortisone 2.5 % cream Apply 1 application topically as needed.     . L-LYSINE PO Take 1 capsule by mouth daily.    Marland Kitchen levothyroxine (SYNTHROID, LEVOTHROID) 25 MCG tablet Take 25 mcg by mouth daily before breakfast.    . memantine (NAMENDA) 10 MG tablet Take 1 tablet (10 mg total) by mouth 2 (two) times daily. 180 tablet 1  . metoprolol tartrate (LOPRESSOR) 25 MG tablet Take 1 tablet (25 mg total) by mouth 2 (two) times daily. 60 tablet 1  . Multiple Vitamins-Minerals (THERA-M) TABS Take 1 tablet by mouth daily.     . Omega-3 Fatty Acids (FISH OIL) 1000 MG CAPS Take 1,000 mg by  mouth daily.     . valACYclovir (VALTREX) 1000 MG tablet Take 1,000 mg by mouth as needed.     . vitamin C (ASCORBIC ACID) 500 MG tablet Take 500 mg by mouth daily.     No facility-administered medications prior to visit.    PAST MEDICAL HISTORY: Past Medical History:  Diagnosis Date  . Alzheimer's disease with early onset ([place])   . Diabetes mellitus without complication ([place])   . Hyperlipidemia    off meds for years  . Hypertension   . Memory loss   . Thyroid disease 05/2014   Patient unsure if hyper or hypo    PAST SURGICAL HISTORY: Past Surgical History:  Procedure Laterality Date  . BREAST BIOPSY     benign  . COLONOSCOPY  approx 2005   patient  not sure of findings or where procedure done  . COLONOSCOPY N/A 10/11/2018   Procedure: COLONOSCOPY;  Surgeon: Rogene Houston, MD;  [facility];  Service: Endoscopy;  Laterality: N/A;  930  . PARTIAL HYSTERECTOMY      FAMILY HISTORY: Family History  Problem Relation Age of Onset  . Colon cancer Father        late 90s  . Liver disease Father        cirrhosis, etoh    SOCIAL HISTORY: Social History   Socioeconomic History  . Marital status: Divorced    Spouse name: Not on file  . Number of children: 1  . Years of education: 57  . Highest education level: Not on file  Occupational History    Employer: [id number]  Tobacco Use  . Smoking status: Never Smoker  . Smokeless tobacco: Never Used  Substance and Sexual Activity  . Alcohol use: No    Alcohol/week: 0.0 standard drinks  . Drug use: No  . Sexual activity: Not on file  Other Topics Concern  . Not on file  Social History Narrative   Patient states she remarried her husband in 84. States he is in jail for cocaine use. Under a lot of stress.   Social Determinants of Health   Financial Resource Strain:   . Difficulty of Paying Living Expenses:   Food Insecurity:   . Worried About Charity fundraiser in the Last Year:   . Arboriculturist in the Last Year:   Transportation Needs:   . Film/video editor (Medical):   Marland Kitchen Lack of Transportation (Non-Medical):   Physical Activity:   . Days of Exercise per Week:   . Minutes of Exercise per Session:   Stress:   . Feeling of Stress :   Social Connections:   . Frequency of Communication with Friends and Family:   . Frequency of Social Gatherings with Friends and Family:   . Attends Religious Services:   . Active Member of Clubs or Organizations:   . Attends Archivist Meetings:   Marland Kitchen Marital Status:   Intimate Partner Violence:   . Fear of Current or Ex-Partner:   . Emotionally Abused:   Marland Kitchen Physically Abused:   . Sexually Abused:        PHYSICAL EXAM  Vitals:   03/05/20 1344  BP: (!) 142/85  Pulse: 71  Temp: 97.7 F (36.5 C)  SpO2: 99%  Weight: 149 lb 6.4 oz (67.8 kg)  Height: 5\' 5"  (1.651 m)   Body mass index is 23.8 kg/m.   MMSE - Mini Mental State Exam 03/05/2020 06/12/2019 09/19/2018  Not completed: - - (  No Data)  Orientation to time 3 2 4   Orientation to Place 5 3 4   Registration 3 3 3   Attention/ Calculation 4 2 5   Recall 0 2 0  Language- name 2 objects 2 2 2   Language- repeat 1 1 1   Language- follow 3 step command 2 3 3   Language- read & follow direction 1 1 1   Write a sentence 1 1 1   Copy design 1 0 1  Total score 23 20 25      Generalized: Well developed, in no acute distress   Neurological examination  Mentation: Alert oriented to time, place, history taking. Follows all commands speech and language fluent Cranial nerve II-XII: Pupils were equal round reactive to light. Extraocular movements were full, visual field were full on confrontational test. Facial sensation and strength were normal. Uvula tongue midline. Head turning and shoulder shrug  were normal and symmetric. Motor: The motor testing reveals 5 over 5 strength of all 4 extremities. Good symmetric motor tone is noted throughout.  Sensory: Sensory testing is intact to soft touch on all 4 extremities. No evidence of extinction is noted.  Coordination: Cerebellar testing reveals good finger-nose-finger and heel-to-shin bilaterally.  Gait and station: Gait is normal. Tandem gait is normal. Romberg is negative. No drift is seen.  Reflexes: Deep tendon reflexes are symmetric and normal bilaterally.   DIAGNOSTIC DATA (LABS, IMAGING, TESTING) - I reviewed patient records, labs, notes, testing and imaging myself where available.  Lab Results  Component Value Date   WBC 10.7 (H) 12/22/2019   HGB 12.3 12/22/2019   HCT 37.6 12/22/2019   MCV 86.4 12/22/2019   PLT 442 (H) 12/22/2019      Component Value Date/Time   NA 137  12/22/2019 0902   NA 142 08/05/2014 1106   K 4.1 12/22/2019 0902   CL 99 12/22/2019 0902   CO2 25 12/22/2019 0902   GLUCOSE 256 (H) 12/22/2019 0902   BUN 19 12/22/2019 0902   BUN 13 08/05/2014 1106   CREATININE 0.73 12/22/2019 0902   CREATININE 0.81 10/12/2019 1021   CALCIUM 8.7 (L) 12/22/2019 0902   CALCIUM 10.3 05/21/2011 1635   PROT 6.5 12/22/2019 0902   PROT 7.1 08/05/2014 1106   ALBUMIN 3.1 (L) 12/22/2019 0902   ALBUMIN 4.8 08/05/2014 1106   AST 48 (H) 12/22/2019 0902   AST 17 05/21/2011 1642   ALT 70 (H) 12/22/2019 0902   ALKPHOS 60 12/22/2019 0902   ALKPHOS 54 05/21/2011 1642   BILITOT 1.3 (H) 12/22/2019 0902   BILITOT 1.4 05/21/2011 1642   GFRNONAA >60 12/22/2019 0902   GFRAA >60 12/22/2019 0902   Lab Results  Component Value Date   CHOL 347 05/21/2011   LDLCALC 231 05/21/2011   TRIG 141 12/18/2019   Lab Results  Component Value Date   HGBA1C 6.8 (H) 12/20/2019   Lab Results  Component Value Date   VITAMINB12 447 08/05/2014   Lab Results  Component Value Date   TSH 0.675 12/22/2019      ASSESSMENT AND PLAN 64 y.o. year old female  has a past medical history of Alzheimer's disease with early onset (HCC), Diabetes mellitus without complication (HCC), Hyperlipidemia, Hypertension, Memory loss, and Thyroid disease (05/2014). here with:  1.  Memory disturbance  -Continue Aricept  -Namenda will be increased to 10 mg twice a day -MMSE 23/30 previously was 20/30 -Advised if symptoms worsen or she develops new symptoms they should let 12/24/2019 know. -Follow-up in 6 months or sooner if needed  I spent 25 minutes of face-to-face and non-face-to-face time with patient.  This included previsit chart review, lab review, study review, order entry, electronic health record documentation, patient education.  Butch Penny, MSN, NP-C 03/05/2020, 1:53 PM [facility] [address], [address] [place], [place] [address] [phone]  I reviewed the above  note and documentation by the Nurse Practitioner and agree with the history, exam, assessment and plan as outlined above. I was available for consultation. [name], MD, PhD [facility])

## 2020-04-23 IMAGING — CT CT HEART MORP W/ CTA COR W/ SCORE W/ CA W/CM &/OR W/O CM
4 of 7 series · 8 of 20 positions shown, 9 images · non-contrast
Comparison: None.
COMPARISON: None.

Addendum:
EXAM:
OVER-READ INTERPRETATION  CT CHEST

The following report is an over-read performed by radiologist Dr.
Rosy Kr [REDACTED] on 10/17/2019. This
over-read does not include interpretation of cardiac or coronary
anatomy or pathology. The coronary calcium score/coronary CTA
interpretation by the cardiologist is attached.
CLINICAL DATA: Chest pain
Cardiac CTA
MEDICATIONS:
Sub lingual nitro. 4mg x 2
TECHNIQUE: The patient was scanned on a Siemens [REDACTED]ice scanner. Gantry
rotation speed was 250 msecs. Collimation was 0.6 mm. A 100 kV
prospective scan was triggered in the ascending thoracic aorta at
35-75% of the R-R interval. Average HR during the scan was 60 bpm.
The 3D data set was interpreted on a dedicated work station using
MPR, MIP and VRT modes. A total of 80cc of contrast was used.

[Series 6: best diast 74 % · axial · 0.39mm/px · z∈[+1222,+1258]mm · 2 of 278 slices shown, 3 images]
[im 93/278  vessel]
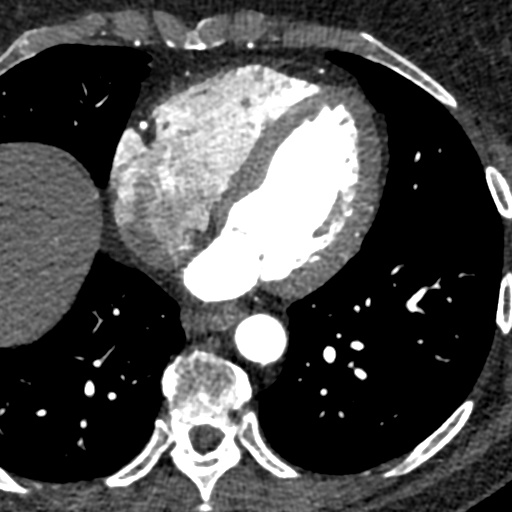
[im 93/278  lung]
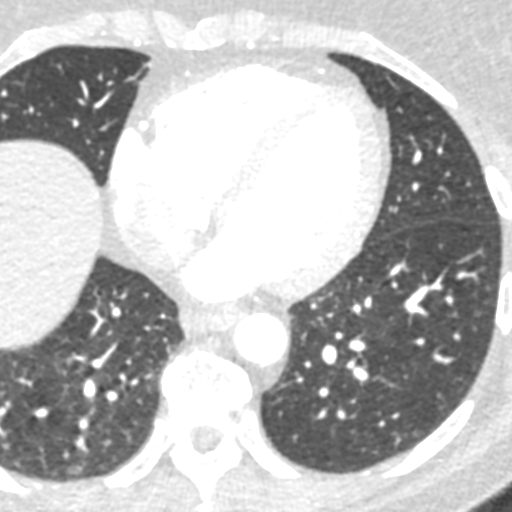
[im 185/278  vessel]
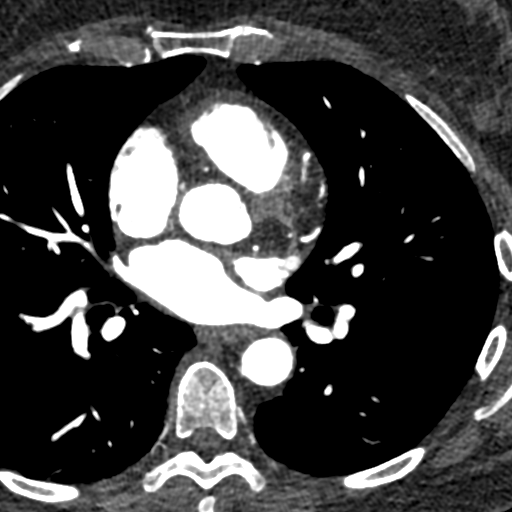

[Series 7: best syst 74 % · axial · 0.39mm/px · z∈[+1222,+1258]mm · 2 of 278 slices shown]
[im 93/278  vessel]
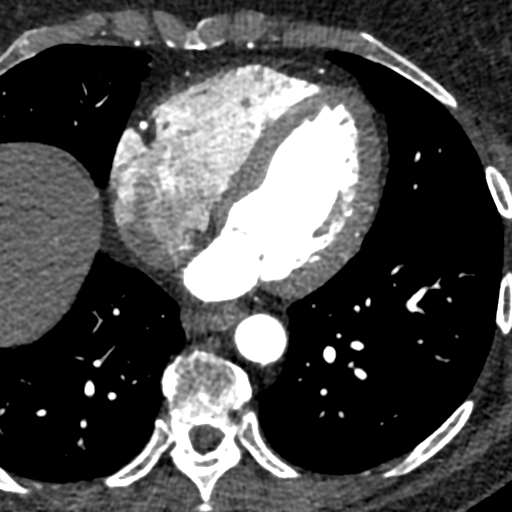
[im 185/278  vessel]
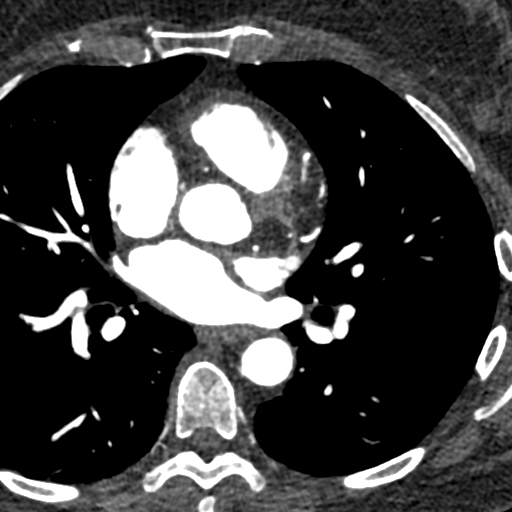

[Series 8: ts diast sharp 74 % · axial · 0.39mm/px · z∈[+1222,+1258]mm · 2 of 278 slices shown]
[im 93/278  lung]
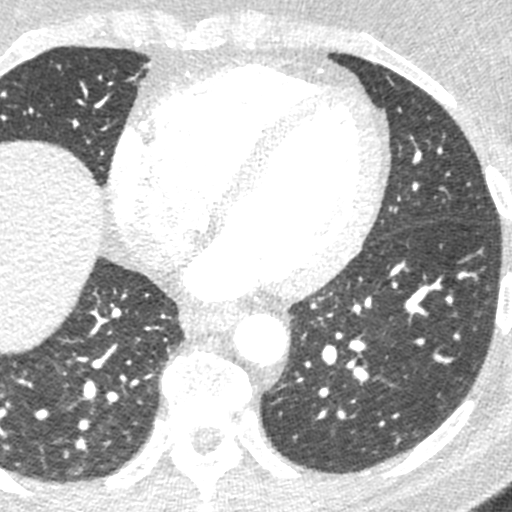
[im 185/278  lung]
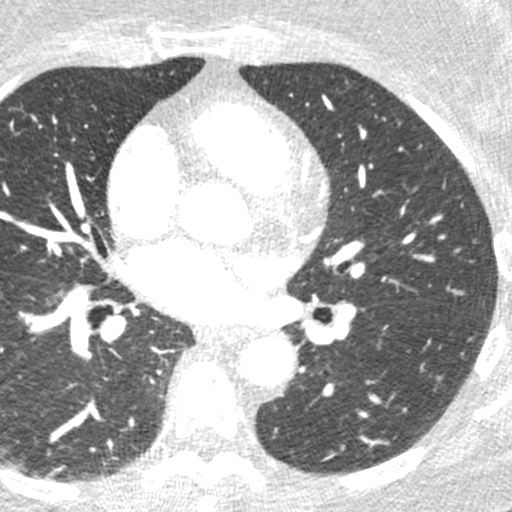

[Series 9: ts syst sharp 74 % · axial · 0.39mm/px · z∈[+1222,+1258]mm · 2 of 278 slices shown]
[im 93/278  lung]
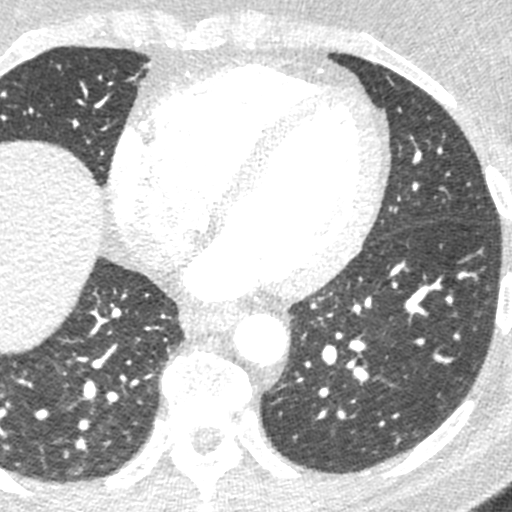
[im 185/278  lung]
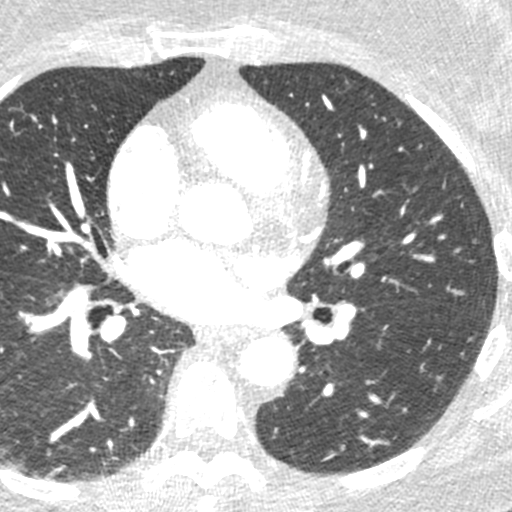

[8 of 20 positions shown; findings below may reference images not displayed]

FINDINGS: Within the visualized portions of the thorax there are no suspicious
appearing pulmonary nodules or masses, there is no acute
consolidative airspace disease, no pleural effusions, no
pneumothorax and no lymphadenopathy. Visualized portions of the
upper abdomen are unremarkable. There are no aggressive appearing
lytic or blastic lesions noted in the visualized portions of the
skeleton.
IMPRESSION: 1. No significant incidental noncardiac findings are noted.
FINDINGS: Non-cardiac: See separate report from [REDACTED].

Pulmonary veins drain normally to the left atrium.

Calcium Score: 290 Agatston units.

Coronary Arteries: Right dominant with no anomalies

LM: No plaque or stenosis.

LAD system: Calcified plaque proximally, mild (<50%) stenosis.
Moderate D1 with calcified plaque proximally, mild (<50%) stenosis.

Circumflex system: Relatively small LCx. Mixed plaque proximally
with mild (<50%) stenosis.

RCA system: Mixed plaque proximal RCA, mild (<50%) stenosis. Mixed
plaque mid RCA, mild (<50%) stenosis.
IMPRESSION: 1. Coronary artery calcium score 290 Agatston units. This places the
patient in the 92nd percentile for age and gender, suggesting high
risk for future cardiac events.

2.  Nonobstructive coronary disease.

Chadd Strachan

*** End of Addendum ***
EXAM:
OVER-READ INTERPRETATION  CT CHEST

The following report is an over-read performed by radiologist Dr.
Rosy Kr [REDACTED] on 10/17/2019. This
over-read does not include interpretation of cardiac or coronary
anatomy or pathology. The coronary calcium score/coronary CTA
interpretation by the cardiologist is attached.
FINDINGS: Within the visualized portions of the thorax there are no suspicious
appearing pulmonary nodules or masses, there is no acute
consolidative airspace disease, no pleural effusions, no
pneumothorax and no lymphadenopathy. Visualized portions of the
upper abdomen are unremarkable. There are no aggressive appearing
lytic or blastic lesions noted in the visualized portions of the
skeleton.
IMPRESSION: 1. No significant incidental noncardiac findings are noted.

## 2020-07-18 ENCOUNTER — Ambulatory Visit: Payer: Medicare HMO | Admitting: Cardiovascular Disease

## 2020-08-15 ENCOUNTER — Encounter: Payer: Self-pay | Admitting: Family Medicine

## 2020-08-15 ENCOUNTER — Other Ambulatory Visit: Payer: Self-pay

## 2020-08-15 ENCOUNTER — Ambulatory Visit: Payer: Medicare HMO | Admitting: Family Medicine

## 2020-08-15 VITALS — BP 120/68 | HR 69 | Ht 64.0 in | Wt 151.8 lb

## 2020-08-15 DIAGNOSIS — I471 Supraventricular tachycardia: Secondary | ICD-10-CM | POA: Diagnosis not present

## 2020-08-15 DIAGNOSIS — I10 Essential (primary) hypertension: Secondary | ICD-10-CM | POA: Diagnosis not present

## 2020-08-15 DIAGNOSIS — I2510 Atherosclerotic heart disease of native coronary artery without angina pectoris: Secondary | ICD-10-CM | POA: Diagnosis not present

## 2020-08-15 DIAGNOSIS — E785 Hyperlipidemia, unspecified: Secondary | ICD-10-CM

## 2020-08-15 NOTE — Progress Notes (Signed)
Cardiology Office Note  Date: 08/15/2020   ID: [name], DOB Dec 24, 1955, MRN [id number]  PCP:  Nicole Apley, NP  Cardiologist:  No primary care provider on file. Electrophysiologist:  None   Chief Complaint: Follow-up CAD  History of Present Illness: Nicole Ward is a 64 y.o. female with a history of CAD, HTN, HLD, PSVT, history of respiratory failure with hypoxia secondary to COVID-19 pneumonia.  January 2021 she was hospitalized for acute respiratory failure secondary to COVID-19 pneumonia.  She was also having episodes of SVT.  She was started on metoprolol 25 mg p.o. twice daily and converted back to normal sinus rhythm.  Last encounter with Dr. Purvis Ward on 01/17/2020 via telemedicine.  She was doing well at that time and denied any chest pain, palpitations or shortness of breath.  She had a coronary CT with CAC score of 290 placing her in the 92nd percentile for age and gender, suggesting high risk for future cardiac events.  Continue aggressive risk factor modification for primary prevention.  She was symptomatically stable.  Blood pressure was well controlled on current therapy.  She is continue atorvastatin for hyperlipidemia.  She was continuing her metoprolol 25 mg p.o. twice daily for PSVT.  Patient is here for 44-month follow-up.  She denies any recent acute illnesses, hospitalizations in the interim since last visit.  She denies any palpitations.  She is no longer taking her metoprolol.  She states she stopped her metoprolol after the immediate.  When she was having palpitations.  Has had no subsequent palpitations since that time.  She denies any anginal or exertional symptoms, palpitations or arrhythmias, orthostatic symptoms, PND, orthopnea, bleeding, CVA or TIA-like symptoms, claudication-like symptoms, DVT or PE-like symptoms, or lower extremity edema.  Blood pressure is well controlled.  Past Medical History:  Diagnosis Date  . Nicole Ward with  early onset (HCC)   . Diabetes mellitus without complication (HCC)   . Hyperlipidemia    off meds for years  . Hypertension   . Memory loss   . Thyroid Ward 05/2014   Patient unsure if hyper or hypo    Past Surgical History:  Procedure Laterality Date  . BREAST BIOPSY     benign  . COLONOSCOPY  approx 2005   patient not sure of findings or where procedure done  . COLONOSCOPY N/A 10/11/2018   Procedure: COLONOSCOPY;  Surgeon: Malissa Hippo, MD;  [facility];  Service: Endoscopy;  Laterality: N/A;  930  . PARTIAL HYSTERECTOMY      Current Outpatient Medications  Medication Sig Dispense Refill  . aspirin EC 81 MG tablet Take 81 mg by mouth daily.    Marland Kitchen atorvastatin (LIPITOR) 40 MG tablet Take 40 mg by mouth daily.     . Cholecalciferol (VITAMIN D PO) Take 1 tablet by mouth daily.    Marland Kitchen donepezil (ARICEPT) 10 MG tablet Take 1 tablet (10 mg total) by mouth at bedtime. 90 tablet 3  . glipiZIDE (GLUCOTROL XL) 2.5 MG 24 hr tablet Take 2.5 mg by mouth daily with breakfast.    . hydrocortisone 2.5 % cream Apply 1 application topically as needed.     . L-LYSINE PO Take 1 capsule by mouth daily.    Marland Kitchen levothyroxine (SYNTHROID, LEVOTHROID) 25 MCG tablet Take 25 mcg by mouth daily before breakfast.    . memantine (NAMENDA) 10 MG tablet Take 1 tablet (10 mg total) by mouth 2 (two) times daily. 180 tablet 3  .  Multiple Vitamins-Minerals (THERA-M) TABS Take 1 tablet by mouth daily.     . Omega-3 Fatty Acids (FISH OIL) 1000 MG CAPS Take 1,000 mg by mouth daily.     . valACYclovir (VALTREX) 1000 MG tablet Take 1,000 mg by mouth as needed.     . vitamin C (ASCORBIC ACID) 500 MG tablet Take 500 mg by mouth daily.    . metoprolol tartrate (LOPRESSOR) 25 MG tablet Take 1 tablet (25 mg total) by mouth 2 (two) times daily. (Patient not taking: Reported on 08/15/2020) 60 tablet 1   No current facility-administered medications for this visit.   Allergies:  Trazodone and nefazodone   Social  History: The patient  reports that she has never smoked. She has never used smokeless tobacco. She reports that she does not drink alcohol and does not use drugs.   Family History: The patient's family history includes Colon cancer in her father; Liver Ward in her father.   ROS:  Please see the history of present illness. Otherwise, complete review of systems is positive for none.  All other systems are reviewed and negative.   Physical Exam: VS:  BP 120/68   Pulse 69   Ht 5\' 4"  (1.626 m)   Wt 151 lb 12.8 oz (68.9 kg)   SpO2 92%   BMI 26.06 kg/m , BMI Body mass index is 26.06 kg/m.  Wt Readings from Last 3 Encounters:  08/15/20 151 lb 12.8 oz (68.9 kg)  03/05/20 149 lb 6.4 oz (67.8 kg)  01/17/20 143 lb (64.9 kg)    General: Patient appears comfortable at rest. Neck: Supple, no elevated JVP or carotid bruits, no thyromegaly. Lungs: Clear to auscultation, nonlabored breathing at rest. Cardiac: Regular rate and rhythm, no S3 or significant systolic murmur, no pericardial rub. Extremities: No pitting edema, distal pulses 2+. Skin: Warm and dry. Musculoskeletal: No kyphosis. Neuropsychiatric: Alert and oriented x3, affect grossly appropriate.  ECG:    Recent Labwork: 12/22/2019: ALT 70; AST 48; BUN 19; Creatinine, Ser 0.73; Hemoglobin 12.3; Magnesium 2.2; Platelets 442; Potassium 4.1; Sodium 137; TSH 0.675     Component Value Date/Time   CHOL 347 05/21/2011 1635   TRIG 141 12/18/2019 2058   LDLCALC 231 05/21/2011 1635    Other Studies Reviewed Today: Coronary CT angiography 10/17/2019:  FINDINGS: Non-cardiac: See separate report from [facility].  Pulmonary veins drain normally to the left atrium.  Calcium Score: 290 Agatston units.  Coronary Arteries: Right dominant with no anomalies  LM: No plaque or stenosis.  LAD system: Calcified plaque proximally, mild (<50%) stenosis. Moderate D1 with calcified plaque proximally, mild (<50%)  stenosis.  Circumflex system: Relatively small LCx. Mixed plaque proximally with mild (<50%) stenosis.  RCA system: Mixed plaque proximal RCA, mild (<50%) stenosis. Mixed plaque mid RCA, mild (<50%) stenosis.  IMPRESSION: 1. Coronary artery calcium score 290 Agatston units. This places the patient in the 92nd percentile for age and gender, suggesting high risk for future cardiac events.  2. Nonobstructive coronary Ward.   Echocardiogram 12/22/2019:  1. Left ventricular ejection fraction, by visual estimation, is 60 to  65%. The left ventricle has normal function. There is no left ventricular  hypertrophy.  2. Left ventricular diastolic parameters are consistent with Grade I  diastolic dysfunction (impaired relaxation).  3. The left ventricle has no regional wall motion abnormalities.  4. Global right ventricle has normal systolic function.The right  ventricular size is normal. No increase in right ventricular wall  thickness.  5. Left atrial size  was normal.  6. Right atrial size was normal.  7. The mitral valve is abnormal. Trivial mitral valve regurgitation.  8. The tricuspid valve is grossly normal.  9. The aortic valve is tricuspid. Aortic valve regurgitation is trivial.  Mild aortic valve sclerosis without stenosis.  10. The pulmonic valve was grossly normal. Pulmonic valve regurgitation is  not visualized.  11. The inferior vena cava is normal in size with greater than 50%  respiratory variability, suggesting right atrial pressure of 3 mmHg.    Assessment and Plan:  1. CAD in native artery   2. Essential (primary) hypertension   3. Hyperlipidemia LDL goal <70   4. SVT (supraventricular tachycardia) (HCC)    1. CAD in native artery Coronary CT November 2020.  CAC score 290.  Placing patient in 92nd percentile for age and gender.  Suggesting high risk for future cardiac events, nonobstructive coronary artery Ward.  Patient denies any anginal  or exertional symptoms.  Continue aspirin 81 mg daily.  2. Essential (primary) hypertension Blood pressure well controlled.  Currently not on antihypertensive medications.  3. Hyperlipidemia LDL goal <70 Continue atorvastatin 40 mg p.o. daily.  Managed by PCP  4. SVT (supraventricular tachycardia) (HCC) Patient stopped taking her metoprolol.  She has not had any further episodes of SVT since stopping the medication.  She believes the palpitations/SVT were related to the fact that she had Covid infection.  She states she feels great and has not had no further issues   Medication Adjustments/Labs and Tests Ordered: Current medicines are reviewed at length with the patient today.  Concerns regarding medicines are outlined above.   Disposition: Follow-up with Dr. Wyline Mood or APP as needed  Signed, Rennis Harding, NP 08/15/2020 2:47 PM    [facility] [address] [place], [place], [place] [address] Phone: [phone]; Fax: [phone]

## 2020-08-15 NOTE — Patient Instructions (Addendum)
Medication Instructions:   Remain off of the Lopressor.   Continue all other current medications.  Labwork: none  Testing/Procedures: none  Follow-Up: As needed   Any Other Special Instructions Will Be Listed Below (If Applicable).  If you need a refill on your cardiac medications before your next appointment, please call your pharmacy.

## 2020-09-08 ENCOUNTER — Ambulatory Visit (INDEPENDENT_AMBULATORY_CARE_PROVIDER_SITE_OTHER): Payer: Medicare HMO | Admitting: Adult Health

## 2020-09-08 ENCOUNTER — Encounter: Payer: Self-pay | Admitting: Adult Health

## 2020-09-08 VITALS — BP 130/80 | HR 74 | Ht 64.0 in | Wt 151.4 lb

## 2020-09-08 DIAGNOSIS — R413 Other amnesia: Secondary | ICD-10-CM

## 2020-09-08 NOTE — Progress Notes (Signed)
PATIENT: Nicole Ward DOB: June 05, 1956  REASON FOR VISIT: follow up HISTORY FROM: patient  HISTORY OF PRESENT ILLNESS: Today 09/08/20:  Nicole Ward is a 64 year old female with a history of memory disturbance.  She returns today for follow-up.  She is here today with her sister.  She feels that her memory has remained stable.  She lives with her daughter.  She is able to complete all ADLs independently.  She operates a motor vehicle without difficulty but only drives to familiar places.  She no longer does much cooking.  Reports that she sleeps well.  Her daughter manages her finances.  She manages her own medications and appointments.  She does have to use a calendar to keep up with her appointments.  She remains on Aricept and Namenda.  HISTORY 03/05/20:  Nicole Ward is a 64 year old female with a history of memory disturbance.  She returns today for follow-up.  She feels that her memory has remained stable.  She lives with her daughter.  She is able to complete all ADLs independently.  She does operate a motor vehicle but only drives to familiar local places.  Denies any long distance driving.  She denies any trouble sleeping.  No change in her mood or behavior.  Denies hallucinations.  She manages her own medications.  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  Her daughter helps her with her finances.  She remains on Aricept at bedtime.  Reports that her prescription for Namenda still stated 1 time a day.  So she is only been taking it at night.  She returns today for an evaluation  REVIEW OF SYSTEMS: Out of a complete 14 system review of symptoms, the patient complains only of the following symptoms, and all other reviewed systems are negative.  See HPI  ALLERGIES: Allergies  Allergen Reactions  . Trazodone And Nefazodone Palpitations    HOME MEDICATIONS: Outpatient Medications Prior to Visit  Medication Sig Dispense Refill  . aspirin EC 81 MG tablet Take 81 mg by mouth daily.    Marland Kitchen  atorvastatin (LIPITOR) 40 MG tablet Take 40 mg by mouth daily.     . Cholecalciferol (VITAMIN D PO) Take 1 tablet by mouth daily.    Marland Kitchen donepezil (ARICEPT) 10 MG tablet Take 1 tablet (10 mg total) by mouth at bedtime. 90 tablet 3  . glipiZIDE (GLUCOTROL XL) 2.5 MG 24 hr tablet Take 2.5 mg by mouth daily with breakfast.    . hydrocortisone 2.5 % cream Apply 1 application topically as needed.     . L-LYSINE PO Take 1 capsule by mouth daily.    Marland Kitchen levothyroxine (SYNTHROID, LEVOTHROID) 25 MCG tablet Take 25 mcg by mouth daily before breakfast.    . memantine (NAMENDA) 10 MG tablet Take 1 tablet (10 mg total) by mouth 2 (two) times daily. 180 tablet 3  . Multiple Vitamins-Minerals (THERA-M) TABS Take 1 tablet by mouth daily.     . Omega-3 Fatty Acids (FISH OIL) 1000 MG CAPS Take 1,000 mg by mouth daily.     . valACYclovir (VALTREX) 1000 MG tablet Take 1,000 mg by mouth as needed.     . vitamin C (ASCORBIC ACID) 500 MG tablet Take 500 mg by mouth daily.     No facility-administered medications prior to visit.    PAST MEDICAL HISTORY: Past Medical History:  Diagnosis Date  . Alzheimer's disease with early onset (HCC)   . Diabetes mellitus without complication (HCC)   . Hyperlipidemia  off meds for years  . Hypertension   . Memory loss   . Thyroid disease 05/2014   Patient unsure if hyper or hypo    PAST SURGICAL HISTORY: Past Surgical History:  Procedure Laterality Date  . BREAST BIOPSY     benign  . COLONOSCOPY  approx 2005   patient not sure of findings or where procedure done  . COLONOSCOPY N/A 10/11/2018   Procedure: COLONOSCOPY;  Surgeon: Malissa Hippo, MD;  [facility];  Service: Endoscopy;  Laterality: N/A;  930  . PARTIAL HYSTERECTOMY      FAMILY HISTORY: Family History  Problem Relation Age of Onset  . Colon cancer Father        late 19s  . Liver disease Father        cirrhosis, etoh    SOCIAL HISTORY: Social History   Socioeconomic History  .  Marital status: Divorced    Spouse name: Not on file  . Number of children: 1  . Years of education: 17  . Highest education level: Not on file  Occupational History    Employer: [employer]  Tobacco Use  . Smoking status: Never Smoker  . Smokeless tobacco: Never Used  Vaping Use  . Vaping Use: Never used  Substance and Sexual Activity  . Alcohol use: No    Alcohol/week: 0.0 standard drinks  . Drug use: No  . Sexual activity: Not on file  Other Topics Concern  . Not on file  Social History Narrative   Patient states she remarried her husband in 62. States he is in jail for cocaine use. Under a lot of stress.   Social Determinants of Health   Financial Resource Strain:   . Difficulty of Paying Living Expenses: Not on file  Food Insecurity:   . Worried About Programme researcher, broadcasting/film/video in the Last Year: Not on file  . Ran Out of Food in the Last Year: Not on file  Transportation Needs:   . Lack of Transportation (Medical): Not on file  . Lack of Transportation (Non-Medical): Not on file  Physical Activity:   . Days of Exercise per Week: Not on file  . Minutes of Exercise per Session: Not on file  Stress:   . Feeling of Stress : Not on file  Social Connections:   . Frequency of Communication with Friends and Family: Not on file  . Frequency of Social Gatherings with Friends and Family: Not on file  . Attends Religious Services: Not on file  . Active Member of Clubs or Organizations: Not on file  . Attends Banker Meetings: Not on file  . Marital Status: Not on file  Intimate Partner Violence:   . Fear of Current or Ex-Partner: Not on file  . Emotionally Abused: Not on file  . Physically Abused: Not on file  . Sexually Abused: Not on file      PHYSICAL EXAM  Vitals:   09/08/20 1416  BP: 130/80  Pulse: 74  Weight: 151 lb 6.4 oz (68.7 kg)  Height: 5\' 4"  (1.626 m)   Body mass index is 25.99 kg/m.   MMSE - Mini Mental State Exam 09/08/2020 03/05/2020 06/12/2019   Not completed: - - -  Orientation to time 4 3 2   Orientation to Place 3 5 3   Registration 3 3 3   Attention/ Calculation 5 4 2   Recall 2 0 2  Language- name 2 objects 2 2 2   Language- repeat 1 1 1   Language-  follow 3 step command 3 2 3   Language- read & follow direction 1 1 1   Write a sentence 1 1 1   Copy design 1 1 0  Total score 26 23 20      Generalized: Well developed, in no acute distress   Neurological examination  Mentation: Alert oriented to time, place, history taking. Follows all commands speech and language fluent Cranial nerve II-XII: Pupils were equal round reactive to light. Extraocular movements were full, visual field were full on confrontational test. Head turning and shoulder shrug  were normal and symmetric. Motor: The motor testing reveals 5 over 5 strength of all 4 extremities. Good symmetric motor tone is noted throughout.  Sensory: Sensory testing is intact to soft touch on all 4 extremities. No evidence of extinction is noted.  Coordination: Cerebellar testing reveals good finger-nose-finger and heel-to-shin bilaterally.  Gait and station: Patient uses a cane when ambulating Reflexes: Deep tendon reflexes are symmetric and normal bilaterally.   DIAGNOSTIC DATA (LABS, IMAGING, TESTING) - I reviewed patient records, labs, notes, testing and imaging myself where available.  Lab Results  Component Value Date   WBC 10.7 (H) 12/22/2019   HGB 12.3 12/22/2019   HCT 37.6 12/22/2019   MCV 86.4 12/22/2019   PLT 442 (H) 12/22/2019      Component Value Date/Time   NA 137 12/22/2019 0902   NA 142 08/05/2014 1106   K 4.1 12/22/2019 0902   CL 99 12/22/2019 0902   CO2 25 12/22/2019 0902   GLUCOSE 256 (H) 12/22/2019 0902   BUN 19 12/22/2019 0902   BUN 13 08/05/2014 1106   CREATININE 0.73 12/22/2019 0902   CREATININE 0.81 10/12/2019 1021   CALCIUM 8.7 (L) 12/22/2019 0902   CALCIUM 10.3 05/21/2011 1635   PROT 6.5 12/22/2019 0902   PROT 7.1 08/05/2014 1106    ALBUMIN 3.1 (L) 12/22/2019 0902   ALBUMIN 4.8 08/05/2014 1106   AST 48 (H) 12/22/2019 0902   AST 17 05/21/2011 1642   ALT 70 (H) 12/22/2019 0902   ALKPHOS 60 12/22/2019 0902   ALKPHOS 54 05/21/2011 1642   BILITOT 1.3 (H) 12/22/2019 0902   BILITOT 1.4 05/21/2011 1642   GFRNONAA >60 12/22/2019 0902   GFRAA >60 12/22/2019 0902   Lab Results  Component Value Date   CHOL 347 05/21/2011   LDLCALC 231 05/21/2011   TRIG 141 12/18/2019   Lab Results  Component Value Date   HGBA1C 6.8 (H) 12/20/2019   Lab Results  Component Value Date   VITAMINB12 447 08/05/2014   Lab Results  Component Value Date   TSH 0.675 12/22/2019      ASSESSMENT AND PLAN 64 y.o. year old female  has a past medical history of Alzheimer's disease with early onset (HCC), Diabetes mellitus without complication (HCC), Hyperlipidemia, Hypertension, Memory loss, and Thyroid disease (05/2014).   1.  Memory disturbance   Continue Namenda and Aricept  Memory score stable  Advised if symptoms worsen or she develops new symptoms she should let 08/07/2014 know  Follow-up in 6 months or sooner if needed   I spent 25 minutes of face-to-face and non-face-to-face time with patient.  This included previsit chart review, lab review, study review, order entry, electronic health record documentation, patient education.  12/24/2019, MSN, NP-C 09/08/2020, 2:47 PM [facility] [address], [address] [place], [place] 11/08/2020 [phone]

## 2020-09-08 NOTE — Patient Instructions (Signed)
Your Plan:  Continue Namenda and Aricept Memory score is stable 23/30 If your symptoms worsen or you develop new symptoms please let us know.    Thank you for coming to see [place] at [facility]. I hope we have been able to provide you high quality care today.  You may receive a patient satisfaction survey over the next few weeks. We would appreciate your feedback and comments so that we may continue to improve ourselves and the health of our patients.

## 2020-09-19 ENCOUNTER — Other Ambulatory Visit (HOSPITAL_COMMUNITY): Payer: Self-pay | Admitting: Adult Health Nurse Practitioner

## 2020-09-19 DIAGNOSIS — Z1231 Encounter for screening mammogram for malignant neoplasm of breast: Secondary | ICD-10-CM

## 2020-11-07 ENCOUNTER — Ambulatory Visit (HOSPITAL_COMMUNITY): Payer: Medicare HMO

## 2020-11-21 ENCOUNTER — Ambulatory Visit (HOSPITAL_COMMUNITY)
Admission: RE | Admit: 2020-11-21 | Discharge: 2020-11-21 | Disposition: A | Discharge status: Home / Self Care | Payer: Medicare HMO | Source: Ambulatory Visit | Attending: Adult Health Nurse Practitioner | Admitting: Adult Health Nurse Practitioner

## 2020-11-21 ENCOUNTER — Other Ambulatory Visit: Payer: Self-pay

## 2020-11-21 DIAGNOSIS — Z1231 Encounter for screening mammogram for malignant neoplasm of breast: Secondary | ICD-10-CM | POA: Diagnosis present

## 2021-03-09 ENCOUNTER — Ambulatory Visit: Payer: Medicare HMO | Admitting: Adult Health

## 2021-03-11 ENCOUNTER — Telehealth: Payer: Self-pay | Admitting: Adult Health

## 2021-03-11 DIAGNOSIS — R413 Other amnesia: Secondary | ICD-10-CM

## 2021-03-11 MED ORDER — MEMANTINE HCL 10 MG PO TABS
10.0000 mg | ORAL_TABLET | Freq: Two times a day (BID) | ORAL | 3 refills | Status: DC
Start: 2021-03-11 — End: 2021-07-22

## 2021-03-11 MED ORDER — DONEPEZIL HCL 10 MG PO TABS
10.0000 mg | ORAL_TABLET | Freq: Every day | ORAL | 3 refills | Status: DC
Start: 2021-03-11 — End: 2022-07-22

## 2021-03-11 NOTE — Addendum Note (Signed)
Addended by: Guy Begin on: 03/11/2021 05:20 PM   Modules accepted: Orders

## 2021-03-11 NOTE — Telephone Encounter (Signed)
Pt is requesting a refill for donepezil (ARICEPT) 10 MG tablet & memantine (NAMENDA) 10 MG tablet.  Pharmacy: [facility]

## 2021-03-16 ENCOUNTER — Ambulatory Visit: Payer: Medicare HMO | Admitting: Adult Health

## 2021-07-22 ENCOUNTER — Ambulatory Visit: Payer: Medicare HMO | Admitting: Adult Health

## 2021-07-22 ENCOUNTER — Encounter: Payer: Self-pay | Admitting: Adult Health

## 2021-07-22 DIAGNOSIS — R413 Other amnesia: Secondary | ICD-10-CM | POA: Diagnosis not present

## 2021-07-22 MED ORDER — MEMANTINE HCL 10 MG PO TABS: 10.0000 mg | ORAL_TABLET | Freq: Two times a day (BID) | ORAL | 3 refills | Status: AC

## 2021-07-22 NOTE — Progress Notes (Addendum)
PATIENT: Nicole Ward DOB: [birth year]  REASON FOR VISIT: follow up HISTORY FROM: patient  HISTORY OF PRESENT ILLNESS: Today 07/22/21:  Nicole Ward is a 65 year old female with a history of memory disturbance.  She returns today for follow-up.  She is here today with her granddaughter.  She states that she lives at home with her daughter and granddaughter.  She feels that her memory is better.  She is able to complete all ADLs independently.  She manages her own medications.  Her daughter helps her manage her finances.  Reports that she is sleeping well.  Reports good appetite.  No change in mood or behavior.  09/08/20: Nicole Ward is a 65 year old female with a history of memory disturbance.  She returns today for follow-up.  She is here today with her sister.  She feels that her memory has remained stable.  She lives with her daughter.  She is able to complete all ADLs independently.  She operates a motor vehicle without difficulty but only drives to familiar places.  She no longer does much cooking.  Reports that she sleeps well.  Her daughter manages her finances.  She manages her own medications and appointments.  She does have to use a calendar to keep up with her appointments.  She remains on Aricept and Namenda.  HISTORY 03/05/20:   Nicole Ward is a 65 year old female with a history of memory disturbance.  She returns today for follow-up.  She feels that her memory has remained stable.  She lives with her daughter.  She is able to complete all ADLs independently.  She does operate a motor vehicle but only drives to familiar local places.  Denies any long distance driving.  She denies any trouble sleeping.  No change in her mood or behavior.  Denies hallucinations.  She manages her own medications.  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  Her daughter helps her with her finances.  She remains on Aricept at bedtime.  Reports that her prescription for Namenda still stated 1 time a day.  So she  is only been taking it at night.  She returns today for an evaluation  REVIEW OF SYSTEMS: Out of a complete 14 system review of symptoms, the patient complains only of the following symptoms, and all other reviewed systems are negative.  See HPI  ALLERGIES: Allergies  Allergen Reactions   Trazodone And Nefazodone Palpitations    HOME MEDICATIONS: Outpatient Medications Prior to Visit  Medication Sig Dispense Refill   aspirin EC 81 MG tablet Take 81 mg by mouth daily.     atorvastatin (LIPITOR) 40 MG tablet Take 40 mg by mouth daily.      Cholecalciferol (VITAMIN D PO) Take 1 tablet by mouth daily.     donepezil (ARICEPT) 10 MG tablet Take 1 tablet (10 mg total) by mouth at bedtime. 90 tablet 3   glipiZIDE (GLUCOTROL XL) 2.5 MG 24 hr tablet Take 2.5 mg by mouth daily with breakfast.     L-LYSINE PO Take 1 capsule by mouth daily.     levothyroxine (SYNTHROID, LEVOTHROID) 25 MCG tablet Take 25 mcg by mouth daily before breakfast.     memantine (NAMENDA) 10 MG tablet Take 1 tablet (10 mg total) by mouth 2 (two) times daily. (Patient taking differently: Take 10 mg by mouth at bedtime.) 180 tablet 3   Multiple Vitamins-Minerals (THERA-M) TABS Take 1 tablet by mouth daily.      Omega-3 Fatty Acids (FISH OIL PO) Take by  mouth in the morning and at bedtime.     valACYclovir (VALTREX) 1000 MG tablet Take 1,000 mg by mouth as needed.      hydrocortisone 2.5 % cream Apply 1 application topically as needed.  (Patient not taking: Reported on 07/22/2021)     Omega-3 Fatty Acids (FISH OIL) 1000 MG CAPS Take 1,000 mg by mouth daily.      vitamin C (ASCORBIC ACID) 500 MG tablet Take 500 mg by mouth daily. (Patient not taking: Reported on 07/22/2021)     No facility-administered medications prior to visit.    PAST MEDICAL HISTORY: Past Medical History:  Diagnosis Date   Alzheimer's disease with early onset (HCC)    Diabetes mellitus without complication (HCC)    Hyperlipidemia    off meds for years    Hypertension    Memory loss    Thyroid disease 05/2014   Patient unsure if hyper or hypo    PAST SURGICAL HISTORY: Past Surgical History:  Procedure Laterality Date   BREAST BIOPSY     benign   COLONOSCOPY  approx 2005   patient not sure of findings or where procedure done   COLONOSCOPY N/A 10/11/2018   Procedure: COLONOSCOPY;  Surgeon: Malissa Hippo, MD;  Location: [facility];  Service: Endoscopy;  Laterality: N/A;  930   PARTIAL HYSTERECTOMY      FAMILY HISTORY: Family History  Problem Relation Age of Onset   Colon cancer Father        late 69s   Liver disease Father        cirrhosis, etoh    SOCIAL HISTORY: Social History   Socioeconomic History   Marital status: Divorced    Spouse name: Not on file   Number of children: 1   Years of education: 12   Highest education level: Not on file  Occupational History    Employer: [employer]  Tobacco Use   Smoking status: Never   Smokeless tobacco: Never  Vaping Use   Vaping Use: Never used  Substance and Sexual Activity   Alcohol use: No    Alcohol/week: 0.0 standard drinks   Drug use: No   Sexual activity: Not on file  Other Topics Concern   Not on file  Social History Narrative   Patient states she remarried her husband in 30. States he is in jail for cocaine use. Under a lot of stress.      Lives at home with daughter and granddaughter   Caffeine: 1 cup coffee every other day   Social Determinants of Health   Financial Resource Strain: Not on file  Food Insecurity: Not on file  Transportation Needs: Not on file  Physical Activity: Not on file  Stress: Not on file  Social Connections: Not on file  Intimate Partner Violence: Not on file      PHYSICAL EXAM  Vitals:   07/22/21 1358  BP: 124/71  Pulse: 78  Weight: 155 lb (70.3 kg)  Height: 5\' 6"  (1.676 m)   Body mass index is 25.02 kg/m.   MMSE - Mini Mental State Exam 07/22/2021 09/08/2020 03/05/2020  Not completed: - - -  Orientation to  time 1 4 3   Orientation to Place 4 3 5   Registration 3 3 3   Attention/ Calculation 4 5 4   Recall 0 2 0  Language- name 2 objects 2 2 2   Language- repeat 1 1 1   Language- follow 3 step command 3 3 2   Language- read & follow direction 1  1 1  Write a sentence 1 1 1   Copy design 0 1 1  Total score 20 26 23      Generalized: Well developed, in no acute distress   Neurological examination  Mentation: Alert oriented to time, place, history taking. Follows all commands speech and language fluent Cranial nerve II-XII: Pupils were equal round reactive to light. Extraocular movements were full, visual field were full on confrontational test. Head turning and shoulder shrug  were normal and symmetric. Motor: The motor testing reveals 5 over 5 strength of all 4 extremities. Good symmetric motor tone is noted throughout.  Sensory: Sensory testing is intact to soft touch on all 4 extremities. No evidence of extinction is noted.  Coordination: Cerebellar testing reveals good finger-nose-finger and heel-to-shin bilaterally.  Gait and station: Patient uses a cane when ambulating Reflexes: Deep tendon reflexes are symmetric and normal bilaterally.   DIAGNOSTIC DATA (LABS, IMAGING, TESTING) - I reviewed patient records, labs, notes, testing and imaging myself where available.  Lab Results  Component Value Date   WBC 10.7 (H) 12/22/2019   HGB 12.3 12/22/2019   HCT 37.6 12/22/2019   MCV 86.4 12/22/2019   PLT 442 (H) 12/22/2019      Component Value Date/Time   NA 137 12/22/2019 0902   NA 142 08/05/2014 1106   K 4.1 12/22/2019 0902   CL 99 12/22/2019 0902   CO2 25 12/22/2019 0902   GLUCOSE 256 (H) 12/22/2019 0902   BUN 19 12/22/2019 0902   BUN 13 08/05/2014 1106   CREATININE 0.73 12/22/2019 0902   CREATININE 0.81 10/12/2019 1021   CALCIUM 8.7 (L) 12/22/2019 0902   CALCIUM 10.3 05/21/2011 1635   PROT 6.5 12/22/2019 0902   PROT 7.1 08/05/2014 1106   ALBUMIN 3.1 (L) 12/22/2019 0902    ALBUMIN 4.8 08/05/2014 1106   AST 48 (H) 12/22/2019 0902   AST 17 05/21/2011 1642   ALT 70 (H) 12/22/2019 0902   ALKPHOS 60 12/22/2019 0902   ALKPHOS 54 05/21/2011 1642   BILITOT 1.3 (H) 12/22/2019 0902   BILITOT 1.4 05/21/2011 1642   GFRNONAA >60 12/22/2019 0902   GFRAA >60 12/22/2019 0902   Lab Results  Component Value Date   CHOL 347 05/21/2011   LDLCALC 231 05/21/2011   TRIG 141 12/18/2019   Lab Results  Component Value Date   HGBA1C 6.8 (H) 12/20/2019   Lab Results  Component Value Date   VITAMINB12 447 08/05/2014   Lab Results  Component Value Date   TSH 0.675 12/22/2019      ASSESSMENT AND PLAN 65 y.o. year old female  has a past medical history of Alzheimer's disease with early onset (HCC), Diabetes mellitus without complication (HCC), Hyperlipidemia, Hypertension, Memory loss, and Thyroid disease (05/2014).   1.  Memory disturbance  Continue Namenda 10 mg BID Continue Aricept 10 mg at bedtime MMSE 20/30 previously 26/30 Advised if symptoms worsen or she develops new symptoms she should let 69 know Follow-up in 61 year or sooner if needed    03-07-1983, MSN, NP-C 07/22/2021, 2:15 PM [facility] [address], [address] [place], [address] [place] [phone]  I reviewed the above note and documentation by the Nurse Practitioner and agree with the history, exam, assessment and plan as outlined above. I was available for consultation. [id number], MD, PhD [facility])

## 2021-07-22 NOTE — Patient Instructions (Signed)
Your Plan:  Continue Aricept and Namenda If your symptoms worsen or you develop new symptoms please let us know.   Thank you for coming to see us at [facility]. I hope we have been able to provide you high quality care today.  You may receive a patient satisfaction survey over the next few weeks. We would appreciate your feedback and comments so that we may continue to improve ourselves and the health of our patients.  

## 2022-07-22 ENCOUNTER — Encounter: Payer: Self-pay | Admitting: Adult Health

## 2022-07-22 ENCOUNTER — Ambulatory Visit: Payer: Medicare HMO | Admitting: Adult Health

## 2022-07-22 DIAGNOSIS — R413 Other amnesia: Secondary | ICD-10-CM

## 2022-07-22 MED ORDER — DONEPEZIL HCL 10 MG PO TABS: 10.0000 mg | ORAL_TABLET | Freq: Every day | ORAL | 3 refills | Status: AC

## 2022-07-22 NOTE — Progress Notes (Signed)
PATIENT: Nicole Ward DOB: [birth year]  REASON FOR VISIT: follow up HISTORY FROM: patient  HISTORY OF PRESENT ILLNESS: Today 07/22/22:  Nicole Ward is a 66 year old female with a history of memory disturbance.  She returns today for follow-up.  She remains on Aricept and Namenda.  States that she is only taking Namenda 1x a day not sure when it changed. Sister feels memory is worse. She lives with her daughter and granddaughter.  She completes all ADLs independently.  No loger cooks uses just a microwave only. Reports that she manages her own medications.  Her daughter helps with her finances and appointments.  Denies any trouble sleeping.  She is able to feed herself, good appetite.  No change in her mood or behavior.  She returns today for evaluation.  07/22/21: Nicole Ward is a 66 year old female with a history of memory disturbance.  She returns today for follow-up.  She is here today with her granddaughter.  She states that she lives at home with her daughter and granddaughter.  She feels that her memory is better.  She is able to complete all ADLs independently.  She manages her own medications.  Her daughter helps her manage her finances.  Reports that she is sleeping well.  Reports good appetite.  No change in mood or behavior.  09/08/20: Nicole Ward is a 66 year old female with a history of memory disturbance.  She returns today for follow-up.  She is here today with her sister.  She feels that her memory has remained stable.  She lives with her daughter.  She is able to complete all ADLs independently.  She operates a motor vehicle without difficulty but only drives to familiar places.  She no longer does much cooking.  Reports that she sleeps well.  Her daughter manages her finances.  She manages her own medications and appointments.  She does have to use a calendar to keep up with her appointments.  She remains on Aricept and Namenda.  HISTORY 03/05/20:   Nicole Ward is a  66 year old female with a history of memory disturbance.  She returns today for follow-up.  She feels that her memory has remained stable.  She lives with her daughter.  She is able to complete all ADLs independently.  She does operate a motor vehicle but only drives to familiar local places.  Denies any long distance driving.  She denies any trouble sleeping.  No change in her mood or behavior.  Denies hallucinations.  She manages her own medications.  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  Her daughter helps her with her finances.  She remains on Aricept at bedtime.  Reports that her prescription for Namenda still stated 1 time a day.  So she is only been taking it at night.  She returns today for an evaluation  REVIEW OF SYSTEMS: Out of a complete 14 system review of symptoms, the patient complains only of the following symptoms, and all other reviewed systems are negative.  See HPI  ALLERGIES: Allergies  Allergen Reactions   Trazodone And Nefazodone Palpitations    HOME MEDICATIONS: Outpatient Medications Prior to Visit  Medication Sig Dispense Refill   aspirin EC 81 MG tablet Take 81 mg by mouth daily.     atorvastatin (LIPITOR) 40 MG tablet Take 40 mg by mouth daily.      Cholecalciferol (VITAMIN D PO) Take 1 tablet by mouth daily.     donepezil (ARICEPT) 10 MG tablet Take 1 tablet (10 mg total)  by mouth at bedtime. 90 tablet 3   glipiZIDE (GLUCOTROL XL) 2.5 MG 24 hr tablet Take 2.5 mg by mouth daily with breakfast.     hydrocortisone 2.5 % cream Apply 1 application topically as needed.  (Patient not taking: Reported on 07/22/2021)     L-LYSINE PO Take 1 capsule by mouth daily.     levothyroxine (SYNTHROID, LEVOTHROID) 25 MCG tablet Take 25 mcg by mouth daily before breakfast.     memantine (NAMENDA) 10 MG tablet Take 1 tablet (10 mg total) by mouth 2 (two) times daily. 180 tablet 3   Multiple Vitamins-Minerals (THERA-M) TABS Take 1 tablet by mouth daily.      Omega-3 Fatty Acids (FISH OIL PO)  Take by mouth in the morning and at bedtime.     valACYclovir (VALTREX) 1000 MG tablet Take 1,000 mg by mouth as needed.      No facility-administered medications prior to visit.    PAST MEDICAL HISTORY: Past Medical History:  Diagnosis Date   Alzheimer's disease with early onset (HCC)    Diabetes mellitus without complication (HCC)    Hyperlipidemia    off meds for years   Hypertension    Memory loss    Thyroid disease 05/2014   Patient unsure if hyper or hypo    PAST SURGICAL HISTORY: Past Surgical History:  Procedure Laterality Date   BREAST BIOPSY     benign   COLONOSCOPY  approx 2005   patient not sure of findings or where procedure done   COLONOSCOPY N/A 10/11/2018   Procedure: COLONOSCOPY;  Surgeon: Malissa Hippo, MD;  Location: [facility];  Service: Endoscopy;  Laterality: N/A;  930   PARTIAL HYSTERECTOMY      FAMILY HISTORY: Family History  Problem Relation Age of Onset   Colon cancer Father        late 17s   Liver disease Father        cirrhosis, etoh    SOCIAL HISTORY: Social History   Socioeconomic History   Marital status: Divorced    Spouse name: Not on file   Number of children: 1   Years of education: 12   Highest education level: Not on file  Occupational History    Employer: [employer]  Tobacco Use   Smoking status: Never   Smokeless tobacco: Never  Vaping Use   Vaping Use: Never used  Substance and Sexual Activity   Alcohol use: No    Alcohol/week: 0.0 standard drinks of alcohol   Drug use: No   Sexual activity: Not on file  Other Topics Concern   Not on file  Social History Narrative   Patient states she remarried her husband in 38. States he is in jail for cocaine use. Under a lot of stress.      Lives at home with daughter and granddaughter   Caffeine: 1 cup coffee every other day   Social Determinants of Health   Financial Resource Strain: Not on file  Food Insecurity: Not on file  Transportation Needs: Not on file   Physical Activity: Not on file  Stress: Not on file  Social Connections: Not on file  Intimate Partner Violence: Not on file      PHYSICAL EXAM  Vitals:   07/22/22 1438  BP: 132/71  Pulse: 78  Weight: 149 lb (67.6 kg)  Height: 5\' 5"  (1.651 m)    Body mass index is 24.79 kg/m.      07/22/2022    2:40 PM 07/22/2021  2:01 PM 09/08/2020    2:19 PM  MMSE - Mini Mental State Exam  Orientation to time 2 1 4   Orientation to Place 4 4 3   Registration 3 3 3   Attention/ Calculation 4 4 5   Recall 0 0 2  Language- name 2 objects 2 2 2   Language- repeat 1 1 1   Language- follow 3 step command 3 3 3   Language- read & follow direction 1 1 1   Write a sentence 1 1 1   Copy design 1 0 1  Total score 22 20 26      Generalized: Well developed, in no acute distress   Neurological examination  Mentation: Alert oriented to time, place, history taking. Follows all commands speech and language fluent Cranial nerve II-XII: Pupils were equal round reactive to light. Extraocular movements were full, visual field were full on confrontational test. Head turning and shoulder shrug  were normal and symmetric. Motor: The motor testing reveals 5 over 5 strength of all 4 extremities. Good symmetric motor tone is noted throughout.  Sensory: Sensory testing is intact to soft touch on all 4 extremities. No evidence of extinction is noted.  Coordination: Cerebellar testing reveals good finger-nose-finger and heel-to-shin bilaterally.  Gait and station: Patient uses a cane when ambulating Reflexes: Deep tendon reflexes are symmetric and normal bilaterally.   DIAGNOSTIC DATA (LABS, IMAGING, TESTING) - I reviewed patient records, labs, notes, testing and imaging myself where available.  Lab Results  Component Value Date   WBC 10.7 (H) 12/22/2019   HGB 12.3 12/22/2019   HCT 37.6 12/22/2019   MCV 86.4 12/22/2019   PLT 442 (H) 12/22/2019      Component Value Date/Time   NA 137 12/22/2019 0902   NA  142 08/05/2014 1106   K 4.1 12/22/2019 0902   CL 99 12/22/2019 0902   CO2 25 12/22/2019 0902   GLUCOSE 256 (H) 12/22/2019 0902   BUN 19 12/22/2019 0902   BUN 13 08/05/2014 1106   CREATININE 0.73 12/22/2019 0902   CREATININE 0.81 10/12/2019 1021   CALCIUM 8.7 (L) 12/22/2019 0902   CALCIUM 10.3 05/21/2011 1635   PROT 6.5 12/22/2019 0902   PROT 7.1 08/05/2014 1106   ALBUMIN 3.1 (L) 12/22/2019 0902   ALBUMIN 4.8 08/05/2014 1106   AST 48 (H) 12/22/2019 0902   AST 17 05/21/2011 1642   ALT 70 (H) 12/22/2019 0902   ALKPHOS 60 12/22/2019 0902   ALKPHOS 54 05/21/2011 1642   BILITOT 1.3 (H) 12/22/2019 0902   BILITOT 1.4 05/21/2011 1642   GFRNONAA >60 12/22/2019 0902   GFRAA >60 12/22/2019 0902   Lab Results  Component Value Date   CHOL 347 05/21/2011   LDLCALC 231 05/21/2011   TRIG 141 12/18/2019   Lab Results  Component Value Date   HGBA1C 6.8 (H) 12/20/2019   Lab Results  Component Value Date   VITAMINB12 447 08/05/2014   Lab Results  Component Value Date   TSH 0.675 12/22/2019      ASSESSMENT AND PLAN 66 y.o. year old female  has a past medical history of Alzheimer's disease with early onset (HCC), Diabetes mellitus without complication (HCC), Hyperlipidemia, Hypertension, Memory loss, and Thyroid disease (05/2014).   1.  Memory disturbance  Continue Namenda 10 mg BID- check prescription dose Continue Aricept 10 mg at bedtime MMSE 22/30 previously 26/30 Advised if symptoms worsen or she develops new symptoms she should let 12/24/2019 know keep for PCP follow-up with our office as needed   12/24/2019, MSN,  NP-C 07/22/2022, 2:28 PM [facility] [address], [address] [place], [place] [address] [phone]  )

## 2022-07-22 NOTE — Patient Instructions (Addendum)
Your Plan:  Continue Aricept and Namenda  Check dose of memantine (namenda) If your symptoms worsen or you develop new symptoms please let us know.  Keep regular follow-ups with PCP     Thank you for coming to see [place] at [facility]. I hope we have been able to provide you high quality care today.  You may receive a patient satisfaction survey over the next few weeks. We would appreciate your feedback and comments so that we may continue to improve ourselves and the health of our patients.

## 2023-02-24 ENCOUNTER — Other Ambulatory Visit (HOSPITAL_COMMUNITY): Payer: Self-pay | Admitting: Adult Health Nurse Practitioner

## 2023-02-24 DIAGNOSIS — Z1231 Encounter for screening mammogram for malignant neoplasm of breast: Secondary | ICD-10-CM

## 2023-03-14 ENCOUNTER — Encounter (HOSPITAL_COMMUNITY): Payer: Self-pay

## 2023-03-14 ENCOUNTER — Ambulatory Visit (HOSPITAL_COMMUNITY)
Admission: RE | Admit: 2023-03-14 | Discharge: 2023-03-14 | Disposition: A | Discharge status: Home / Self Care | Payer: Medicare HMO | Source: Ambulatory Visit | Attending: Adult Health Nurse Practitioner | Admitting: Adult Health Nurse Practitioner

## 2023-03-14 DIAGNOSIS — Z1231 Encounter for screening mammogram for malignant neoplasm of breast: Secondary | ICD-10-CM | POA: Diagnosis present

## 2024-05-24 ENCOUNTER — Other Ambulatory Visit (HOSPITAL_COMMUNITY): Payer: Self-pay | Admitting: Adult Health Nurse Practitioner

## 2024-05-24 DIAGNOSIS — Z1231 Encounter for screening mammogram for malignant neoplasm of breast: Secondary | ICD-10-CM

## 2024-06-06 ENCOUNTER — Ambulatory Visit (HOSPITAL_COMMUNITY)
Admission: RE | Admit: 2024-06-06 | Discharge: 2024-06-06 | Disposition: A | Discharge status: Home / Self Care | Source: Ambulatory Visit | Attending: Adult Health Nurse Practitioner | Admitting: Adult Health Nurse Practitioner

## 2024-06-06 DIAGNOSIS — Z1231 Encounter for screening mammogram for malignant neoplasm of breast: Secondary | ICD-10-CM | POA: Diagnosis present
